# Patient Record
Sex: Female | Born: 1990
Health system: Southern US, Community
[De-identification: ages and names within clinical notes are randomized; demographics above are authoritative.]

## PROBLEM LIST (undated history)

## (undated) DIAGNOSIS — Z8619 Personal history of other infectious and parasitic diseases: Secondary | ICD-10-CM

## (undated) HISTORY — DX: Personal history of other infectious and parasitic diseases: Z86.19

---

## 2010-10-25 HISTORY — PX: WISDOM TOOTH EXTRACTION: SHX21

## 2017-07-28 DIAGNOSIS — N979 Female infertility, unspecified: Secondary | ICD-10-CM | POA: Diagnosis not present

## 2017-07-28 DIAGNOSIS — Z01419 Encounter for gynecological examination (general) (routine) without abnormal findings: Secondary | ICD-10-CM | POA: Diagnosis not present

## 2017-08-05 ENCOUNTER — Other Ambulatory Visit (HOSPITAL_COMMUNITY): Payer: Self-pay | Admitting: Obstetrics and Gynecology

## 2017-08-05 DIAGNOSIS — Z3141 Encounter for fertility testing: Secondary | ICD-10-CM

## 2017-08-08 DIAGNOSIS — N979 Female infertility, unspecified: Secondary | ICD-10-CM | POA: Diagnosis not present

## 2017-08-11 ENCOUNTER — Encounter (HOSPITAL_COMMUNITY): Payer: Self-pay | Admitting: Radiology

## 2017-08-11 ENCOUNTER — Ambulatory Visit (HOSPITAL_COMMUNITY)
Admission: RE | Admit: 2017-08-11 | Discharge: 2017-08-11 | Disposition: A | Discharge status: Home / Self Care | Payer: 59 | Source: Ambulatory Visit | Attending: Obstetrics and Gynecology | Admitting: Obstetrics and Gynecology

## 2017-08-11 DIAGNOSIS — Z3141 Encounter for fertility testing: Secondary | ICD-10-CM | POA: Diagnosis not present

## 2017-08-11 MED ORDER — IOPAMIDOL (ISOVUE-300) INJECTION 61%
30.0000 mL | Freq: Once | INTRAVENOUS | Status: AC | PRN
  Administered 2017-08-11: 5 mL

## 2017-08-25 DIAGNOSIS — N979 Female infertility, unspecified: Secondary | ICD-10-CM | POA: Diagnosis not present

## 2017-10-10 DIAGNOSIS — N911 Secondary amenorrhea: Secondary | ICD-10-CM | POA: Diagnosis not present

## 2017-10-13 DIAGNOSIS — Z3401 Encounter for supervision of normal first pregnancy, first trimester: Secondary | ICD-10-CM | POA: Diagnosis not present

## 2017-10-13 DIAGNOSIS — Z13228 Encounter for screening for other metabolic disorders: Secondary | ICD-10-CM | POA: Diagnosis not present

## 2017-10-13 DIAGNOSIS — Z3685 Encounter for antenatal screening for Streptococcus B: Secondary | ICD-10-CM | POA: Diagnosis not present

## 2017-10-13 LAB — OB RESULTS CONSOLE ABO/RH: RH Type: POSITIVE

## 2017-10-13 LAB — OB RESULTS CONSOLE GC/CHLAMYDIA
Chlamydia: NEGATIVE
GC PROBE AMP, GENITAL: NEGATIVE

## 2017-10-13 LAB — OB RESULTS CONSOLE HEPATITIS B SURFACE ANTIGEN: Hepatitis B Surface Ag: NEGATIVE

## 2017-10-13 LAB — OB RESULTS CONSOLE RPR: RPR: NONREACTIVE

## 2017-10-13 LAB — OB RESULTS CONSOLE HIV ANTIBODY (ROUTINE TESTING): HIV: NONREACTIVE

## 2017-10-13 LAB — OB RESULTS CONSOLE RUBELLA ANTIBODY, IGM: Rubella: IMMUNE

## 2017-10-13 LAB — OB RESULTS CONSOLE ANTIBODY SCREEN: ANTIBODY SCREEN: NEGATIVE

## 2017-10-28 DIAGNOSIS — Z3480 Encounter for supervision of other normal pregnancy, unspecified trimester: Secondary | ICD-10-CM | POA: Diagnosis not present

## 2017-10-28 DIAGNOSIS — Z113 Encounter for screening for infections with a predominantly sexual mode of transmission: Secondary | ICD-10-CM | POA: Diagnosis not present

## 2017-10-28 DIAGNOSIS — Z3401 Encounter for supervision of normal first pregnancy, first trimester: Secondary | ICD-10-CM | POA: Diagnosis not present

## 2017-10-28 LAB — HM PAP SMEAR: HM Pap smear: NORMAL

## 2017-11-17 DIAGNOSIS — Z3683 Encounter for fetal screening for congenital cardiac abnormalities: Secondary | ICD-10-CM | POA: Diagnosis not present

## 2017-11-17 DIAGNOSIS — Z3A12 12 weeks gestation of pregnancy: Secondary | ICD-10-CM | POA: Diagnosis not present

## 2017-11-17 DIAGNOSIS — Z36 Encounter for antenatal screening for chromosomal anomalies: Secondary | ICD-10-CM | POA: Diagnosis not present

## 2017-11-17 DIAGNOSIS — Z3491 Encounter for supervision of normal pregnancy, unspecified, first trimester: Secondary | ICD-10-CM | POA: Diagnosis not present

## 2017-12-29 DIAGNOSIS — Z3A18 18 weeks gestation of pregnancy: Secondary | ICD-10-CM | POA: Diagnosis not present

## 2017-12-29 DIAGNOSIS — Z3400 Encounter for supervision of normal first pregnancy, unspecified trimester: Secondary | ICD-10-CM | POA: Diagnosis not present

## 2017-12-29 DIAGNOSIS — Z363 Encounter for antenatal screening for malformations: Secondary | ICD-10-CM | POA: Diagnosis not present

## 2018-02-23 DIAGNOSIS — Z23 Encounter for immunization: Secondary | ICD-10-CM | POA: Diagnosis not present

## 2018-02-23 DIAGNOSIS — Z3402 Encounter for supervision of normal first pregnancy, second trimester: Secondary | ICD-10-CM | POA: Diagnosis not present

## 2018-02-23 DIAGNOSIS — Z3480 Encounter for supervision of other normal pregnancy, unspecified trimester: Secondary | ICD-10-CM | POA: Diagnosis not present

## 2018-03-12 NOTE — Progress Notes (Signed)
Subjective:    Patient ID: Ashley Allison, female    DOB: Oct 26, 1990, 27 y.o.   MRN: [id number]  HPI Chief Complaint  Patient presents with  . new cpe    new cpe, non fasting   She is new to the practice and here for a complete physical exam. Previous medical care: OB/GYN Lived in [place], [place] prior to moving here. Graduated from [employer].   States she is [redacted] weeks pregnant with her first child. She is getting regular perinatal care. Denies any issues or concerns.   Other providers: Dr. Velvet Bathe [facility]   Social history: Lives with her husband, works as a Advertising account planner PT  Denies smoking, drinking alcohol, drug use  Diet: fairly healthy  Excerise: very active at work  Immunizations: [employer] employee  Health maintenance:  Mammogram: N/A Colonoscopy: N/A Last Gynecological Exam:  Last Menstrual cycle: Pregnancies: 1 Last Dental Exam: twice annually  Last Eye Exam: June 2018 contacts   Wears seatbelt always, uses sunscreen, smoke detectors in home and functioning, does not text while driving and feels safe in home environment.   Reviewed allergies, medications, past medical, surgical, family, and social history.   Review of Systems Review of Systems Constitutional: -fever, -chills, -sweats, -unexpected weight change,-fatigue ENT: -runny nose, -ear pain, -sore throat Cardiology:  -chest pain, -palpitations, -edema Respiratory: -cough, -shortness of breath, -wheezing Gastroenterology: -abdominal pain, -nausea, -vomiting, -diarrhea, -constipation  Hematology: -bleeding or bruising problems Musculoskeletal: -arthralgias, -myalgias, -joint swelling, -back pain Ophthalmology: -vision changes Urology: -dysuria, -difficulty urinating, -hematuria, -urinary frequency, -urgency Neurology: -headache, -weakness, -tingling, -numbness       Objective:   Physical Exam BP 120/64   Pulse 100   Resp 16   Ht 5' 5.5" (1.664 m)   Wt 155 lb 8 oz (70.5 kg)   LMP 08/08/2017  (Approximate)   SpO2 98%   BMI 25.48 kg/m   General Appearance:    Alert, cooperative, no distress, appears stated age  Head:    Normocephalic, without obvious abnormality, atraumatic  Eyes:    PERRL, conjunctiva/corneas clear, EOM's intact, fundi    benign  Ears:    Normal TM's and external ear canals  Nose:   Nares normal, mucosa normal, no drainage or sinus   tenderness  Throat:   Lips, mucosa, and tongue normal; teeth and gums normal  Neck:   Supple, no lymphadenopathy;  thyroid:  no   enlargement/tenderness/nodules; no carotid   bruit or JVD  Back:    Spine nontender, no curvature, ROM normal, no CVA     tenderness  Lungs:     Clear to auscultation bilaterally without wheezes, rales or     ronchi; respirations unlabored  Chest Wall:    No tenderness or deformity   Heart:    Regular rate and rhythm, S1 and S2 normal, no murmur, rub   or gallop  Breast Exam:    OB/GYN   Abdomen:     Pregnant, normoactive bowel sounds      Genitalia:    OB/GYN. Pregnant.   Rectal:    Not performed due to age<40 and no related complaints  Extremities:   No clubbing, cyanosis or edema  Pulses:   2+ and symmetric all extremities  Skin:   Skin color, texture, turgor normal, no rashes or lesions  Lymph nodes:   Cervical, supraclavicular, and axillary nodes normal  Neurologic:   CNII-XII intact, normal strength, sensation and gait; reflexes 2+ and symmetric throughout  Psych:   Normal mood, affect, hygiene and grooming.     Urinalysis dipstick: negative       Assessment & Plan:  Routine general medical examination at a health care facility - Plan: POCT Urinalysis DIP (Proadvantage Device), CBC with Differential/Platelet, Comprehensive metabolic panel, Lipid panel  Screening for lipid disorders - Plan: Lipid panel  [redacted] weeks gestation of pregnancy  She appears to be doing well physically and emotionally.  Getting regular prenatal care and her first pregnancy seems to be going  well. Discussed safety and health promotion. Labs done per patient request. Follow-up as needed.

## 2018-03-13 ENCOUNTER — Ambulatory Visit (INDEPENDENT_AMBULATORY_CARE_PROVIDER_SITE_OTHER): Payer: 59 | Admitting: Family Medicine

## 2018-03-13 ENCOUNTER — Encounter: Payer: Self-pay | Admitting: Family Medicine

## 2018-03-13 VITALS — BP 120/64 | HR 100 | Resp 16 | Ht 65.5 in | Wt 155.5 lb

## 2018-03-13 DIAGNOSIS — Z0000 Encounter for general adult medical examination without abnormal findings: Secondary | ICD-10-CM

## 2018-03-13 DIAGNOSIS — Z13220 Encounter for screening for lipoid disorders: Secondary | ICD-10-CM

## 2018-03-13 DIAGNOSIS — Z3A29 29 weeks gestation of pregnancy: Secondary | ICD-10-CM

## 2018-03-13 LAB — POCT URINALYSIS DIP (PROADVANTAGE DEVICE)
BILIRUBIN UA: NEGATIVE mg/dL
Bilirubin, UA: NEGATIVE
Blood, UA: NEGATIVE
Glucose, UA: NEGATIVE mg/dL
Leukocytes, UA: NEGATIVE
Nitrite, UA: NEGATIVE
PH UA: 7 (ref 5.0–8.0)
PROTEIN UA: NEGATIVE mg/dL
Specific Gravity, Urine: 1.015
Urobilinogen, Ur: NEGATIVE

## 2018-03-13 NOTE — Patient Instructions (Signed)

## 2018-03-14 ENCOUNTER — Encounter: Payer: Self-pay | Admitting: Family Medicine

## 2018-03-14 LAB — COMPREHENSIVE METABOLIC PANEL
ALT: 14 IU/L (ref 0–32)
AST: 17 IU/L (ref 0–40)
Albumin/Globulin Ratio: 1.5 (ref 1.2–2.2)
Albumin: 3.7 g/dL (ref 3.5–5.5)
Alkaline Phosphatase: 114 IU/L (ref 39–117)
BILIRUBIN TOTAL: 0.7 mg/dL (ref 0.0–1.2)
BUN/Creatinine Ratio: 16 (ref 9–23)
BUN: 7 mg/dL (ref 6–20)
CHLORIDE: 102 mmol/L (ref 96–106)
CO2: 19 mmol/L — AB (ref 20–29)
CREATININE: 0.45 mg/dL — AB (ref 0.57–1.00)
Calcium: 9.3 mg/dL (ref 8.7–10.2)
GFR calc non Af Amer: 137 mL/min/{1.73_m2} (ref >59)
GFR, EST AFRICAN AMERICAN: 158 mL/min/{1.73_m2} (ref >59)
GLUCOSE: 46 mg/dL — AB (ref 65–99)
Globulin, Total: 2.4 g/dL (ref 1.5–4.5)
Potassium: 3.7 mmol/L (ref 3.5–5.2)
Sodium: 140 mmol/L (ref 134–144)
Total Protein: 6.1 g/dL (ref 6.0–8.5)

## 2018-03-14 LAB — CBC WITH DIFFERENTIAL/PLATELET
BASOS ABS: 0 10*3/uL (ref 0.0–0.2)
Basos: 0 %
EOS (ABSOLUTE): 0.1 10*3/uL (ref 0.0–0.4)
Eos: 1 %
HEMOGLOBIN: 12.5 g/dL (ref 11.1–15.9)
Hematocrit: 36.5 % (ref 34.0–46.6)
IMMATURE GRANULOCYTES: 0 %
Immature Grans (Abs): 0 10*3/uL (ref 0.0–0.1)
LYMPHS ABS: 1.8 10*3/uL (ref 0.7–3.1)
Lymphs: 16 %
MCH: 31.5 pg (ref 26.6–33.0)
MCHC: 34.2 g/dL (ref 31.5–35.7)
MCV: 92 fL (ref 79–97)
Monocytes Absolute: 0.5 10*3/uL (ref 0.1–0.9)
Monocytes: 5 %
NEUTROS ABS: 8.7 10*3/uL — AB (ref 1.4–7.0)
Neutrophils: 78 %
PLATELETS: 199 10*3/uL (ref 150–450)
RBC: 3.97 x10E6/uL (ref 3.77–5.28)
RDW: 13.9 % (ref 12.3–15.4)
WBC: 11.2 10*3/uL — ABNORMAL HIGH (ref 3.4–10.8)

## 2018-03-14 LAB — LIPID PANEL
CHOLESTEROL TOTAL: 272 mg/dL — AB (ref 100–199)
Chol/HDL Ratio: 3.7 ratio (ref 0.0–4.4)
HDL: 73 mg/dL (ref >39)
LDL CALC: 163 mg/dL — AB (ref 0–99)
Triglycerides: 182 mg/dL — ABNORMAL HIGH (ref 0–149)
VLDL CHOLESTEROL CAL: 36 mg/dL (ref 5–40)

## 2018-04-26 DIAGNOSIS — Z3685 Encounter for antenatal screening for Streptococcus B: Secondary | ICD-10-CM | POA: Diagnosis not present

## 2018-04-26 LAB — OB RESULTS CONSOLE GBS: STREP GROUP B AG: NEGATIVE

## 2018-05-18 ENCOUNTER — Encounter (HOSPITAL_COMMUNITY): Payer: Self-pay | Admitting: *Deleted

## 2018-05-18 ENCOUNTER — Telehealth (HOSPITAL_COMMUNITY): Payer: Self-pay | Admitting: *Deleted

## 2018-05-18 NOTE — Telephone Encounter (Signed)
Preadmission screen  

## 2018-05-30 ENCOUNTER — Inpatient Hospital Stay (HOSPITAL_COMMUNITY): Payer: 59 | Admitting: Anesthesiology

## 2018-05-30 ENCOUNTER — Other Ambulatory Visit: Payer: Self-pay

## 2018-05-30 ENCOUNTER — Inpatient Hospital Stay (HOSPITAL_COMMUNITY)
Admission: RE | Admit: 2018-05-30 | Discharge: 2018-06-02 | DRG: 788 | Disposition: A | Discharge status: Home / Self Care | Payer: 59 | Attending: Obstetrics and Gynecology | Admitting: Obstetrics and Gynecology

## 2018-05-30 ENCOUNTER — Encounter (HOSPITAL_COMMUNITY): Payer: Self-pay

## 2018-05-30 DIAGNOSIS — Z3A40 40 weeks gestation of pregnancy: Secondary | ICD-10-CM

## 2018-05-30 DIAGNOSIS — Z3483 Encounter for supervision of other normal pregnancy, third trimester: Secondary | ICD-10-CM | POA: Diagnosis present

## 2018-05-30 DIAGNOSIS — Z3A00 Weeks of gestation of pregnancy not specified: Secondary | ICD-10-CM | POA: Diagnosis not present

## 2018-05-30 DIAGNOSIS — O411230 Chorioamnionitis, third trimester, not applicable or unspecified: Secondary | ICD-10-CM | POA: Diagnosis not present

## 2018-05-30 DIAGNOSIS — Z98891 History of uterine scar from previous surgery: Secondary | ICD-10-CM

## 2018-05-30 LAB — CBC
HCT: 38.1 % (ref 36.0–46.0)
Hemoglobin: 13.3 g/dL (ref 12.0–15.0)
MCH: 32.1 pg (ref 26.0–34.0)
MCHC: 34.9 g/dL (ref 30.0–36.0)
MCV: 92 fL (ref 78.0–100.0)
PLATELETS: 167 10*3/uL (ref 150–400)
RBC: 4.14 MIL/uL (ref 3.87–5.11)
RDW: 13.5 % (ref 11.5–15.5)
WBC: 9.8 10*3/uL (ref 4.0–10.5)

## 2018-05-30 LAB — TYPE AND SCREEN
ABO/RH(D): AB POS
ANTIBODY SCREEN: NEGATIVE

## 2018-05-30 LAB — ABO/RH: ABO/RH(D): AB POS

## 2018-05-30 LAB — RPR: RPR Ser Ql: NONREACTIVE

## 2018-05-30 LAB — POCT FERN TEST: POCT FERN TEST: POSITIVE

## 2018-05-30 MED ORDER — EPHEDRINE 5 MG/ML INJ: 10.0000 mg | INTRAVENOUS | Status: DC | PRN

## 2018-05-30 MED ORDER — PHENYLEPHRINE 40 MCG/ML (10ML) SYRINGE FOR IV PUSH (FOR BLOOD PRESSURE SUPPORT): 80.0000 ug | PREFILLED_SYRINGE | INTRAVENOUS | Status: DC | PRN

## 2018-05-30 MED ORDER — FENTANYL 2.5 MCG/ML BUPIVACAINE 1/10 % EPIDURAL INFUSION (WH - ANES)
14.0000 mL/h | INTRAMUSCULAR | Status: DC | PRN
  Administered 2018-05-30 (×2): 14 mL/h via EPIDURAL
  Filled 2018-05-30 (×2): qty 100

## 2018-05-30 MED ORDER — OXYCODONE-ACETAMINOPHEN 5-325 MG PO TABS: 1.0000 | ORAL_TABLET | ORAL | Status: DC | PRN

## 2018-05-30 MED ORDER — OXYTOCIN 40 UNITS IN LACTATED RINGERS INFUSION - SIMPLE MED
1.0000 m[IU]/min | INTRAVENOUS | Status: DC
  Administered 2018-05-30: 2 m[IU]/min via INTRAVENOUS
  Filled 2018-05-30: qty 1000

## 2018-05-30 MED ORDER — LACTATED RINGERS IV SOLN
500.0000 mL | INTRAVENOUS | Status: DC | PRN
  Administered 2018-05-30: 500 mL via INTRAVENOUS

## 2018-05-30 MED ORDER — LACTATED RINGERS IV SOLN: 500.0000 mL | Freq: Once | INTRAVENOUS | Status: DC

## 2018-05-30 MED ORDER — OXYTOCIN 40 UNITS IN LACTATED RINGERS INFUSION - SIMPLE MED: 2.5000 [IU]/h | INTRAVENOUS | Status: DC

## 2018-05-30 MED ORDER — ONDANSETRON HCL 4 MG/2ML IJ SOLN
4.0000 mg | Freq: Four times a day (QID) | INTRAMUSCULAR | Status: DC | PRN
  Administered 2018-05-30: 4 mg via INTRAVENOUS
  Filled 2018-05-30: qty 2

## 2018-05-30 MED ORDER — DIPHENHYDRAMINE HCL 50 MG/ML IJ SOLN: 12.5000 mg | INTRAMUSCULAR | Status: DC | PRN

## 2018-05-30 MED ORDER — PHENYLEPHRINE 40 MCG/ML (10ML) SYRINGE FOR IV PUSH (FOR BLOOD PRESSURE SUPPORT)
80.0000 ug | PREFILLED_SYRINGE | INTRAVENOUS | Status: DC | PRN
  Filled 2018-05-30: qty 10

## 2018-05-30 MED ORDER — TERBUTALINE SULFATE 1 MG/ML IJ SOLN: 0.2500 mg | Freq: Once | INTRAMUSCULAR | Status: DC | PRN

## 2018-05-30 MED ORDER — LIDOCAINE HCL (PF) 1 % IJ SOLN
INTRAMUSCULAR | Status: DC | PRN
  Administered 2018-05-30: 5 mL via EPIDURAL

## 2018-05-30 MED ORDER — LIDOCAINE HCL (PF) 1 % IJ SOLN: 30.0000 mL | INTRAMUSCULAR | Status: DC | PRN

## 2018-05-30 MED ORDER — SOD CITRATE-CITRIC ACID 500-334 MG/5ML PO SOLN
30.0000 mL | ORAL | Status: DC | PRN
  Administered 2018-05-30 – 2018-05-31 (×2): 30 mL via ORAL
  Filled 2018-05-30 (×2): qty 15

## 2018-05-30 MED ORDER — ACETAMINOPHEN 325 MG PO TABS: 650.0000 mg | ORAL_TABLET | ORAL | Status: DC | PRN

## 2018-05-30 MED ORDER — OXYCODONE-ACETAMINOPHEN 5-325 MG PO TABS: 2.0000 | ORAL_TABLET | ORAL | Status: DC | PRN

## 2018-05-30 MED ORDER — OXYTOCIN BOLUS FROM INFUSION: 500.0000 mL | Freq: Once | INTRAVENOUS | Status: DC

## 2018-05-30 MED ORDER — FLEET ENEMA 7-19 GM/118ML RE ENEM
1.0000 | ENEMA | RECTAL | Status: DC | PRN
Start: 2018-05-30 — End: 2018-05-31

## 2018-05-30 MED ORDER — LACTATED RINGERS IV SOLN
INTRAVENOUS | Status: DC
  Administered 2018-05-30 – 2018-05-31 (×4): via INTRAVENOUS

## 2018-05-30 NOTE — Anesthesia Pain Management Evaluation Note (Signed)
  CRNA Pain Management Visit Note  Patient: Ashley Allison, 27 y.o., female  "Hello I am a member of the anesthesia team at [facility]. We have an anesthesia team available at all times to provide care throughout the hospital, including epidural management and anesthesia for C-section. I don't know your plan for the delivery whether it a natural birth, water birth, IV sedation, nitrous supplementation, doula or epidural, but we want to meet your pain goals."   1.Was your pain managed to your expectations on prior hospitalizations?   No prior hospitalizations  2.What is your expectation for pain management during this hospitalization?     Epidural  3.How can we help you reach that goal? Epidural when ready  Record the patient's initial score and the patient's pain goal.   Pain: 6  Pain Goal: 8 [facility] wants you to be able to say your pain was always managed very well.  Edison PaceWILKERSON,Milena Liggett 05/30/2018

## 2018-05-30 NOTE — Anesthesia Preprocedure Evaluation (Signed)
Anesthesia Evaluation  Patient identified by MRN, date of birth, ID band Patient awake    Reviewed: Allergy & Precautions, NPO status , Patient's Chart, lab work & pertinent test results  Airway Mallampati: II  TM Distance: >3 FB Neck ROM: Full    Dental no notable dental hx. (+) Teeth Intact   Pulmonary neg pulmonary ROS,    Pulmonary exam normal breath sounds clear to auscultation       Cardiovascular Exercise Tolerance: Good negative cardio ROS Normal cardiovascular exam Rhythm:Regular Rate:Normal     Neuro/Psych negative neurological ROS  negative psych ROS   GI/Hepatic negative GI ROS, Neg liver ROS,   Endo/Other    Renal/GU negative Renal ROS     Musculoskeletal   Abdominal   Peds  Hematology  (+) anemia ,   Anesthesia Other Findings   Reproductive/Obstetrics (+) Pregnancy                             Lab Results  Component Value Date   WBC 9.8 05/30/2018   HGB 13.3 05/30/2018   HCT 38.1 05/30/2018   MCV 92.0 05/30/2018   PLT 167 05/30/2018    Anesthesia Physical Anesthesia Plan  ASA: II  Anesthesia Plan: Epidural   Post-op Pain Management:    Induction:   PONV Risk Score and Plan:   Airway Management Planned:   Additional Equipment:   Intra-op Plan:   Post-operative Plan:   Informed Consent: I have reviewed the patients History and Physical, chart, labs and discussed the procedure including the risks, benefits and alternatives for the proposed anesthesia with the patient or authorized representative who has indicated his/her understanding and acceptance.     Plan Discussed with:   Anesthesia Plan Comments:         Anesthesia Quick Evaluation

## 2018-05-30 NOTE — Anesthesia Procedure Notes (Signed)
Epidural Patient location during procedure: OB Start time: 05/30/2018 11:28 AM End time: 05/30/2018 11:44 AM  Staffing Anesthesiologist: Trevor IhaHouser, Stephen A, MD Performed: anesthesiologist   Preanesthetic Checklist Completed: patient identified, site marked, surgical consent, pre-op evaluation, timeout performed, IV checked, risks and benefits discussed and monitors and equipment checked  Epidural Patient position: sitting Prep: site prepped and draped and DuraPrep Patient monitoring: continuous pulse ox and blood pressure Approach: midline Location: L3-L4 Injection technique: LOR air  Needle:  Needle type: Tuohy  Needle gauge: 17 G Needle length: 9 cm and 9 Needle insertion depth: 5 cm cm Catheter type: closed end flexible Catheter size: 19 Gauge Catheter at skin depth: 11 cm Test dose: negative  Assessment Events: blood not aspirated, injection not painful, no injection resistance, negative IV test and no paresthesia  Additional Notes One attempt . Pt tolerated procedure well.

## 2018-05-30 NOTE — Progress Notes (Signed)
Pt comfortable w/ ctx  FHT cat 1 Toco Q2, mild Cvx 4/80/-2  A/P:  Continue pitocin Exp mngt

## 2018-05-30 NOTE — Progress Notes (Signed)
Pt feeling occasional ctx  FHT cat 1 Toco Q10 Cvx 2/60/-2, vtx  A/P  Will augment w/ pitocin

## 2018-05-30 NOTE — Progress Notes (Signed)
Comfortable w/ epidural  FHT 120s w/ occasional early decels Toco Q2 Cvx 8/C/0 IUPC & FSE placed  A/P:  Continue pitocin augmentation Exp mngt

## 2018-05-30 NOTE — H&P (Signed)
Oda CoganKimberly Quinonez is a 27 y.o. female presenting for SROM, clear fluid.  Pregnancy uncomplicated.  OB History    Gravida  1   Para      Term      Preterm      AB      Living        SAB      TAB      Ectopic      Multiple      Live Births             Past Medical History:  Diagnosis Date  . History of chicken pox    Past Surgical History:  Procedure Laterality Date  . WISDOM TOOTH EXTRACTION  2012   Family History: family history includes Bladder Cancer in her maternal grandmother; Breast cancer in her paternal aunt; Hypertension in her father; Liver cancer (age of onset: 1984) in her paternal grandfather. Social History:  reports that she has never smoked. She has never used smokeless tobacco. She reports that she drinks alcohol. She reports that she does not use drugs.     Maternal Diabetes: No Genetic Screening: Normal Maternal Ultrasounds/Referrals: Normal Fetal Ultrasounds or other Referrals:  None Maternal Substance Abuse:  No Significant Maternal Medications:  None Significant Maternal Lab Results:  None Other Comments:  None  ROS History Dilation: 1 Effacement (%): 70 Station: -2 Exam by:: T Lytle RN Blood pressure 133/82, pulse 88, temperature 98.2 F (36.8 C), temperature source Oral, resp. rate 17, height 5\' 5"  (1.651 m), weight 173 lb 1.3 oz (78.5 kg), last menstrual period 08/08/2017. Exam Physical Exam  Prenatal labs: ABO, Rh: A/Positive/-- (12/20 0000) Antibody: Negative (12/20 0000) Rubella: Immune (12/20 0000) RPR: Nonreactive (12/20 0000)  HBsAg: Negative (12/20 0000)  HIV: Non-reactive (12/20 0000)  GBS:   negative  Assessment/Plan: Admit Pitocin augmentation  Zelphia CairoGretchen Jamauri Kruzel 05/30/2018, 7:59 AM

## 2018-05-30 NOTE — MAU Note (Signed)
Pt felt gush of fluid at 0330 and again at 0530, clear fluid. Also has been cramping since then. Denies bleeding. +FM

## 2018-05-31 ENCOUNTER — Inpatient Hospital Stay (HOSPITAL_COMMUNITY): Admission: RE | Admit: 2018-05-31 | Payer: 59 | Source: Ambulatory Visit

## 2018-05-31 ENCOUNTER — Encounter (HOSPITAL_COMMUNITY)
Admission: RE | Disposition: A | Discharge status: Home / Self Care | Payer: Self-pay | Source: Home / Self Care | Attending: Obstetrics and Gynecology

## 2018-05-31 ENCOUNTER — Encounter (HOSPITAL_COMMUNITY): Payer: Self-pay

## 2018-05-31 DIAGNOSIS — Z98891 History of uterine scar from previous surgery: Secondary | ICD-10-CM

## 2018-05-31 LAB — CBC
HEMATOCRIT: 29.8 % — AB (ref 36.0–46.0)
Hemoglobin: 10.7 g/dL — ABNORMAL LOW (ref 12.0–15.0)
MCH: 32.7 pg (ref 26.0–34.0)
MCHC: 35.9 g/dL (ref 30.0–36.0)
MCV: 91.1 fL (ref 78.0–100.0)
Platelets: 144 10*3/uL — ABNORMAL LOW (ref 150–400)
RBC: 3.27 MIL/uL — ABNORMAL LOW (ref 3.87–5.11)
RDW: 13.2 % (ref 11.5–15.5)
WBC: 16.1 10*3/uL — ABNORMAL HIGH (ref 4.0–10.5)

## 2018-05-31 SURGERY — Surgical Case
Anesthesia: Epidural

## 2018-05-31 MED ORDER — ONDANSETRON HCL 4 MG/2ML IJ SOLN
INTRAMUSCULAR | Status: AC
  Filled 2018-05-31: qty 2

## 2018-05-31 MED ORDER — WITCH HAZEL-GLYCERIN EX PADS: 1.0000 "application " | MEDICATED_PAD | CUTANEOUS | Status: DC | PRN

## 2018-05-31 MED ORDER — TETANUS-DIPHTH-ACELL PERTUSSIS 5-2.5-18.5 LF-MCG/0.5 IM SUSP
0.5000 mL | Freq: Once | INTRAMUSCULAR | Status: DC
  Filled 2018-05-31: qty 0.5

## 2018-05-31 MED ORDER — MEPERIDINE HCL 25 MG/ML IJ SOLN: 6.2500 mg | INTRAMUSCULAR | Status: DC | PRN

## 2018-05-31 MED ORDER — OXYCODONE-ACETAMINOPHEN 5-325 MG PO TABS: 1.0000 | ORAL_TABLET | ORAL | Status: DC | PRN

## 2018-05-31 MED ORDER — PROMETHAZINE HCL 25 MG/ML IJ SOLN: 6.2500 mg | INTRAMUSCULAR | Status: DC | PRN

## 2018-05-31 MED ORDER — OXYCODONE-ACETAMINOPHEN 5-325 MG PO TABS: 2.0000 | ORAL_TABLET | ORAL | Status: DC | PRN

## 2018-05-31 MED ORDER — MEDROXYPROGESTERONE ACETATE 150 MG/ML IM SUSP: 150.0000 mg | INTRAMUSCULAR | Status: DC | PRN

## 2018-05-31 MED ORDER — FENTANYL CITRATE (PF) 100 MCG/2ML IJ SOLN
INTRAMUSCULAR | Status: AC
Start: 2018-05-31 — End: ?
  Filled 2018-05-31: qty 2

## 2018-05-31 MED ORDER — HYDROMORPHONE HCL 1 MG/ML IJ SOLN: 0.2500 mg | INTRAMUSCULAR | Status: DC | PRN

## 2018-05-31 MED ORDER — KETOROLAC TROMETHAMINE 30 MG/ML IJ SOLN
INTRAMUSCULAR | Status: AC
  Filled 2018-05-31: qty 1

## 2018-05-31 MED ORDER — SCOPOLAMINE 1 MG/3DAYS TD PT72
MEDICATED_PATCH | TRANSDERMAL | Status: DC | PRN
  Administered 2018-05-31: 1 via TRANSDERMAL

## 2018-05-31 MED ORDER — SENNOSIDES-DOCUSATE SODIUM 8.6-50 MG PO TABS
2.0000 | ORAL_TABLET | ORAL | Status: DC
  Administered 2018-06-01 (×2): 2 via ORAL
  Filled 2018-05-31 (×4): qty 2

## 2018-05-31 MED ORDER — PRENATAL MULTIVITAMIN CH
1.0000 | ORAL_TABLET | Freq: Every day | ORAL | Status: DC
  Administered 2018-05-31 – 2018-06-02 (×3): 1 via ORAL
  Filled 2018-05-31 (×5): qty 1

## 2018-05-31 MED ORDER — ONDANSETRON HCL 4 MG/2ML IJ SOLN
INTRAMUSCULAR | Status: DC | PRN
  Administered 2018-05-31: 4 mg via INTRAVENOUS

## 2018-05-31 MED ORDER — DIBUCAINE 1 % RE OINT
1.0000 "application " | TOPICAL_OINTMENT | RECTAL | Status: DC | PRN
  Filled 2018-05-31: qty 28

## 2018-05-31 MED ORDER — LACTATED RINGERS IV SOLN
INTRAVENOUS | Status: DC | PRN
  Administered 2018-05-31: 03:00:00 via INTRAVENOUS
  Administered 2018-05-31: 40 [IU] via INTRAVENOUS

## 2018-05-31 MED ORDER — COCONUT OIL OIL
1.0000 "application " | TOPICAL_OIL | Status: DC | PRN
  Administered 2018-06-02: 1 via TOPICAL
  Filled 2018-05-31 (×2): qty 120

## 2018-05-31 MED ORDER — KETOROLAC TROMETHAMINE 30 MG/ML IJ SOLN: 30.0000 mg | Freq: Once | INTRAMUSCULAR | Status: DC | PRN

## 2018-05-31 MED ORDER — CEFAZOLIN SODIUM-DEXTROSE 2-3 GM-%(50ML) IV SOLR
INTRAVENOUS | Status: DC | PRN
  Administered 2018-05-31: 2 g via INTRAVENOUS

## 2018-05-31 MED ORDER — ACETAMINOPHEN 325 MG PO TABS
650.0000 mg | ORAL_TABLET | ORAL | Status: DC | PRN
  Administered 2018-06-02: 650 mg via ORAL
  Filled 2018-05-31: qty 2

## 2018-05-31 MED ORDER — DIPHENHYDRAMINE HCL 25 MG PO CAPS
25.0000 mg | ORAL_CAPSULE | Freq: Four times a day (QID) | ORAL | Status: DC | PRN
  Filled 2018-05-31: qty 1

## 2018-05-31 MED ORDER — MENTHOL 3 MG MT LOZG
1.0000 | LOZENGE | OROMUCOSAL | Status: DC | PRN
  Filled 2018-05-31: qty 9

## 2018-05-31 MED ORDER — IBUPROFEN 600 MG PO TABS
600.0000 mg | ORAL_TABLET | Freq: Four times a day (QID) | ORAL | Status: DC
  Administered 2018-05-31 – 2018-06-02 (×9): 600 mg via ORAL
  Filled 2018-05-31 (×9): qty 1

## 2018-05-31 MED ORDER — DEXTROSE IN LACTATED RINGERS 5 % IV SOLN
INTRAVENOUS | Status: DC
  Administered 2018-05-31 (×2): via INTRAVENOUS

## 2018-05-31 MED ORDER — FENTANYL CITRATE (PF) 100 MCG/2ML IJ SOLN
INTRAMUSCULAR | Status: DC | PRN
  Administered 2018-05-31: 100 ug via EPIDURAL

## 2018-05-31 MED ORDER — MEASLES, MUMPS & RUBELLA VAC ~~LOC~~ INJ
0.5000 mL | INJECTION | Freq: Once | SUBCUTANEOUS | Status: DC
  Filled 2018-05-31: qty 0.5

## 2018-05-31 MED ORDER — SIMETHICONE 80 MG PO CHEW
80.0000 mg | CHEWABLE_TABLET | ORAL | Status: DC
  Administered 2018-06-01 (×2): 80 mg via ORAL
  Filled 2018-05-31 (×4): qty 1

## 2018-05-31 MED ORDER — OXYTOCIN 10 UNIT/ML IJ SOLN
INTRAMUSCULAR | Status: AC
  Filled 2018-05-31: qty 4

## 2018-05-31 MED ORDER — DEXAMETHASONE SODIUM PHOSPHATE 10 MG/ML IJ SOLN
INTRAMUSCULAR | Status: DC | PRN
  Administered 2018-05-31: 4 mg via INTRAVENOUS

## 2018-05-31 MED ORDER — SIMETHICONE 80 MG PO CHEW
80.0000 mg | CHEWABLE_TABLET | ORAL | Status: DC | PRN
  Filled 2018-05-31: qty 1

## 2018-05-31 MED ORDER — DEXAMETHASONE SODIUM PHOSPHATE 4 MG/ML IJ SOLN
INTRAMUSCULAR | Status: AC
Start: 2018-05-31 — End: ?
  Filled 2018-05-31: qty 1

## 2018-05-31 MED ORDER — OXYTOCIN 40 UNITS IN LACTATED RINGERS INFUSION - SIMPLE MED: 2.5000 [IU]/h | INTRAVENOUS | Status: AC

## 2018-05-31 MED ORDER — SODIUM BICARBONATE 8.4 % IV SOLN
INTRAVENOUS | Status: DC | PRN
  Administered 2018-05-31: 10 mL via EPIDURAL

## 2018-05-31 MED ORDER — ACETAMINOPHEN 10 MG/ML IV SOLN
INTRAVENOUS | Status: DC | PRN
  Administered 2018-05-31: 1000 mg via INTRAVENOUS

## 2018-05-31 MED ORDER — SIMETHICONE 80 MG PO CHEW
80.0000 mg | CHEWABLE_TABLET | Freq: Three times a day (TID) | ORAL | Status: DC
  Administered 2018-05-31 – 2018-06-01 (×5): 80 mg via ORAL
  Filled 2018-05-31 (×12): qty 1

## 2018-05-31 SURGICAL SUPPLY — 32 items
CHLORAPREP W/TINT 26ML (MISCELLANEOUS) ×3 IMPLANT
CLAMP CORD UMBIL (MISCELLANEOUS) ×3 IMPLANT
CLOTH BEACON ORANGE TIMEOUT ST (SAFETY) ×3 IMPLANT
DERMABOND ADHESIVE PROPEN (GAUZE/BANDAGES/DRESSINGS) ×2
DERMABOND ADVANCED (GAUZE/BANDAGES/DRESSINGS) ×2
DERMABOND ADVANCED .7 DNX12 (GAUZE/BANDAGES/DRESSINGS) ×1 IMPLANT
DERMABOND ADVANCED .7 DNX6 (GAUZE/BANDAGES/DRESSINGS) ×1 IMPLANT
DRSG OPSITE POSTOP 4X10 (GAUZE/BANDAGES/DRESSINGS) ×3 IMPLANT
ELECT REM PT RETURN 9FT ADLT (ELECTROSURGICAL) ×3
ELECTRODE REM PT RTRN 9FT ADLT (ELECTROSURGICAL) ×1 IMPLANT
EXTRACTOR VACUUM M CUP 4 TUBE (SUCTIONS) IMPLANT
EXTRACTOR VACUUM M CUP 4' TUBE (SUCTIONS)
GLOVE BIO SURGEON STRL SZ 6.5 (GLOVE) ×4 IMPLANT
GLOVE BIO SURGEONS STRL SZ 6.5 (GLOVE) ×2
GLOVE BIOGEL PI IND STRL 7.0 (GLOVE) ×4 IMPLANT
GLOVE BIOGEL PI INDICATOR 7.0 (GLOVE) ×8
GOWN STRL REUS W/TWL LRG LVL3 (GOWN DISPOSABLE) ×6 IMPLANT
KIT ABG SYR 3ML LUER SLIP (SYRINGE) IMPLANT
NEEDLE HYPO 25X5/8 SAFETYGLIDE (NEEDLE) IMPLANT
NS IRRIG 1000ML POUR BTL (IV SOLUTION) ×3 IMPLANT
PACK C SECTION WH (CUSTOM PROCEDURE TRAY) ×3 IMPLANT
PAD OB MATERNITY 4.3X12.25 (PERSONAL CARE ITEMS) ×3 IMPLANT
PENCIL SMOKE EVAC W/HOLSTER (ELECTROSURGICAL) ×3 IMPLANT
SUT CHROMIC 0 CT 802H (SUTURE) IMPLANT
SUT CHROMIC 0 CTX 36 (SUTURE) ×9 IMPLANT
SUT MON AB-0 CT1 36 (SUTURE) ×3 IMPLANT
SUT PDS AB 0 CTX 60 (SUTURE) ×3 IMPLANT
SUT PLAIN 0 NONE (SUTURE) IMPLANT
SUT VIC AB 4-0 KS 27 (SUTURE) ×3 IMPLANT
SYR BULB 3OZ (MISCELLANEOUS) ×3 IMPLANT
TOWEL OR 17X24 6PK STRL BLUE (TOWEL DISPOSABLE) ×3 IMPLANT
TRAY FOLEY W/BAG SLVR 14FR LF (SET/KITS/TRAYS/PACK) IMPLANT

## 2018-05-31 NOTE — Transfer of Care (Signed)
Immediate Anesthesia Transfer of Care Note  Patient: Ashley CoganKimberly Schowalter  Procedure(s) Performed: CESAREAN SECTION (N/A )  Patient Location: PACU  Anesthesia Type:Epidural  Level of Consciousness: awake  Airway & Oxygen Therapy: Patient Spontanous Breathing  Post-op Assessment: Report given to RN and Post -op Vital signs reviewed and stable  Post vital signs: Reviewed and stable  Last Vitals:  Vitals Value Taken Time  BP 117/80 05/31/2018  3:49 AM  Temp    Pulse 111 05/31/2018  3:53 AM  Resp 20 05/31/2018  3:53 AM  SpO2 99 % 05/31/2018  3:53 AM  Vitals shown include unvalidated device data.  Last Pain:  Vitals:   05/31/18 0114  TempSrc: Oral  PainSc:       Patients Stated Pain Goal: 8 (05/30/18 0810)  Complications: No apparent anesthesia complications

## 2018-05-31 NOTE — Lactation Note (Signed)
This note was copied from a baby's chart. Lactation Consultation Note  Patient Name: Ashley Allison Reason for consult: Follow-up assessment;Difficult latch  [name] whose infant is now 4719 hours old.  RN request for latch assistance  RN requested latch assistance due to baby being able to latch but not willing to suck.  Baby in bassinet as I arrived but showing feeding cues.  Offered to assist with latching and mother accepted.  Placed mother in her preferred position (football hold) with adequate pillow support.  Assisted baby to latch onto left breast without difficulty.  However, once at the breast he became very restless and fussy and would not initiate sucking.  I burped him and attempted again with the same results.  Suggested that mother hand express some colostrum while I assessed his suck.  Baby does not have a coordinated suck.  With my gloved finger I attempted to get him to latch and suck my finger.  Again, he did not willingly begin sucking.  With cheek and jaw support and practice he began sucking on my finger with strong bursts.  Without the cheek and jaw support he was unable to maintain a latch on my finger.  Showed the parents how I was helping him and suggested they continue this before latching.  Mother was able to hand express approximately 11 mls of colostrum which dad spoon fed back to baby while I held him and guided the feedings.  Baby quieted down after spoon feeding and burped easily.  Placed him back STS with mother where he was content.  Encouraged mother to continue feeding 8-12 times/24 hours or more if he shows cues.  Continue STS, breast massage and hand expression before and after feeds.  She has colostrum containers for any EBM she obtains and will feed back to baby using the spoon.  Parents very eager to learn and work well together.  Mother will call for latch assistance as needed.  I reassured her this may take a couple of feedings  but feel sure he will soon learn to suck more effectively.  Parents grateful for help and will call as needed.  RN updated.   Maternal Data Formula Feeding for Exclusion: No Has patient been taught Hand Expression?: Yes Does the patient have breastfeeding experience prior to this delivery?: No  Feeding Feeding Type: Breast Fed Length of feed: 0 min  LATCH Score Latch: Too sleepy or reluctant, no latch achieved, no sucking elicited.  Audible Swallowing: None  Type of Nipple: Everted at rest and after stimulation  Comfort (Breast/Nipple): Soft / non-tender  Hold (Positioning): Assistance needed to correctly position infant at breast and maintain latch.  LATCH Score: 5  Interventions Interventions: Breast feeding basics reviewed;Assisted with latch;Skin to skin;Breast massage;Hand express;Expressed milk;Position options;Support pillows;Adjust position;Breast compression  Lactation Tools Discussed/Used     Consult Status Consult Status: Follow-up Date: 06/01/18 Follow-up type: In-patient    Thornton Dohrmann R Woodley Petzold Allison, 11:02 PM

## 2018-05-31 NOTE — Progress Notes (Signed)
Pt comfortable.  FHT reassuring but 2-3 episodes of decels to 90s.  Resolves with position change and pitocin off Toco Q3-4, mild off pitocin Cvx 9/C/0  A/P:  Will restart pitocin discucssed c-section if FHT decels recur with pitocin back on

## 2018-05-31 NOTE — Progress Notes (Signed)
<  10 min after restart pitocin at - FHT decel to 90s Pitocin turned off and position change Recommend primary c-section, pt and husband agree R/b/a discussed, questions answered, informed consent

## 2018-05-31 NOTE — Anesthesia Postprocedure Evaluation (Signed)
Anesthesia Post Note  Patient: Ashley CoganKimberly Allison  Procedure(s) Performed: CESAREAN SECTION (N/A )     Patient location during evaluation: Mother Baby Anesthesia Type: Epidural Level of consciousness: awake and alert Pain management: pain level controlled Vital Signs Assessment: post-procedure vital signs reviewed and stable Respiratory status: spontaneous breathing, nonlabored ventilation and respiratory function stable Cardiovascular status: stable Postop Assessment: no headache, no backache, epidural receding, able to ambulate, adequate PO intake, no apparent nausea or vomiting and patient able to bend at knees Anesthetic complications: no    Last Vitals:  Vitals:   05/31/18 0828 05/31/18 0943  BP: 109/79 112/68  Pulse: 71 99  Resp: 16 16  Temp: 37.6 C 36.8 C  SpO2:      Last Pain:  Vitals:   05/31/18 0943  TempSrc: Axillary  PainSc:    Pain Goal: Patients Stated Pain Goal: 8 (05/30/18 0810)               Laban EmperorMalinova,Stacyann Mcconaughy Hristova

## 2018-05-31 NOTE — Lactation Note (Signed)
This note was copied from a baby's chart. Lactation Consultation Note Baby 6 hr old. Saw mom in PACU. Baby spitting, not interested in BF. Has rooted, not wanting to sustain latch to feed. Mom has semi flat short shaft compressible areolas/nipples. Hand expression demonstrated. Discussed briefly BF, newborn feeding habits, hand expression, spoon feeding, STS, cluster feeding, supply and demand. Taking hand pump, DEBP/kit, and shells to rm. Mom will be f/u at later time when baby is more active to feed.  Patient Name: Ashley Allison Date: 05/31/2018 Reason for consult: Initial assessment   Maternal Data Has patient been taught Hand Expression?: Yes Does the patient have breastfeeding experience prior to this delivery?: No  Feeding    LATCH Score Latch: Too sleepy or reluctant, no latch achieved, no sucking elicited.     Type of Nipple: Everted at rest and after stimulation(semi flat/very short shaft)  Comfort (Breast/Nipple): Soft / non-tender        Interventions Interventions: Skin to skin;Breast massage;Hand express;DEBP;Hand pump;Breast compression(saw in PACU took supplys to rm.)  Lactation Tools Discussed/Used Tools: Shells;Pump Shell Type: Inverted Breast pump type: Double-Electric Breast Pump;Manual   Consult Status Consult Status: Follow-up Date: 05/31/18 Follow-up type: In-patient    Theodoro Kalata 05/31/2018, 4:42 AM

## 2018-05-31 NOTE — Lactation Note (Addendum)
This note was copied from a baby's chart. Lactation Consultation Note  Patient Name: Ashley Allison [name]'s Date: 05/31/2018 Reason for consult: Follow-up assessment  When I entered room, parents were spoon-feeding "Ashley Ralpharker." Dad was taught how to allow infant to lap from spoon. Slight heart-shaped tongue was noted on extension with spoon feeding.  Mom reports significant breast changes w/pregnancy. She reports having leaked for the last 1-2 months. Mom is able to hand express well, but we were able to improve her technique.   We attempted to get Ashley RalphParker to latch to bare breast using the teacup hold, but he was unable to do so. Mom had been using a size 16 NS, but had not had much luck. We tried the size 20 NS & prefilled it with hand-expressed colostrum.  Overall, infant did well.  Mom knows that if we continue to use the nipple shield, we should express milk 4-6 times/day.  Mom is willing to learn about using a DEBP later today.   Lurline HareRichey, Tieisha Darden [facility] 05/31/2018, 1:34 PM

## 2018-05-31 NOTE — Consult Note (Signed)
Neonatology Note:   Attendance at C-section:    I was asked by Dr. Renaldo FiddlerAdkins to attend this primary C/S at term due to [facility]. The mother is a G1P0 A pos, GBS neg with an uncomplicated pregnancy. ROM 24 hours prior to delivery, fluid clear. Infant vigorous with good spontaneous cry and tone. Delayed cord clamping was done. Needed no suctioning. Ap 9/9. Lungs clear to ausc in DR. Infant is able to remain with his mother for skin to skin time under nursing supervision. Transferred to the care of Pediatrician.   Doretha Souhristie C. Novelle Addair, MD

## 2018-05-31 NOTE — Op Note (Signed)
Cesarean Section Procedure Note   Ashley Allison  05/30/2018 - 05/31/2018  Indications: fetal intolerance to labor   Pre-operative Diagnosis: primary cesarean section for fetal intolerance to labor.   Post-operative Diagnosis: Same   Surgeon: Moishe SpiceSurgeon(s) and Role:    Zelphia Cairo* Elmond Poehlman, MD - Primary   Assistants: none  Anesthesia: epidural   Procedure Details:  The patient was seen in the Holding Room. The risks, benefits, complications, treatment options, and expected outcomes were discussed with the patient. The patient concurred with the proposed plan, giving informed consent. identified as Ashley Allison and the procedure verified as C-Section Delivery. A Time Out was held and the above information confirmed.  After induction of anesthesia, the patient was draped and prepped in the usual sterile manner. A transverse incision was made and carried down through the subcutaneous tissue to the fascia. Fascial incision was made and extended transversely. The fascia was separated from the underlying rectus tissue superiorly and inferiorly. The peritoneum was identified and entered. Peritoneal incision was extended longitudinally. The utero-vesical peritoneal reflection was incised transversely and the bladder flap was bluntly freed from the lower uterine segment. A low transverse uterine incision was made. Delivered from cephalic presentation was a viable female infant. Cord ph was not sent the umbilical cord was clamped and cut cord blood was obtained for evaluation. The placenta was removed Intact and appeared normal. The uterine outline, tubes and ovaries appeared normal}. The uterine incision was closed with running locked sutures of 0chromic gut.   Hemostasis was observed. Lavage was carried out until clear. The fascia was then reapproximated with running sutures of 0 PDS.  The skin was closed with 4-0Vicryl.   Instrument, sponge, and needle counts were correct prior the abdominal closure and were  correct at the conclusion of the case.     Estimated Blood Loss: 810 mL   Urine Output: clearing  Specimens: @ORSPECIMEN @   Complications: no complications  Disposition: PACU - hemodynamically stable.   Maternal Condition: stable   Baby condition / location:  Couplet care  Attending Attestation: I was present and scrubbed for the entire procedure.   Signed: Surgeon(s): Zelphia CairoAdkins, Makeila Yamaguchi, MD

## 2018-06-01 MED ORDER — BREAST MILK
ORAL | Status: DC
  Filled 2018-06-01: qty 1

## 2018-06-01 NOTE — Progress Notes (Signed)
Subjective: Postpartum Day 1: Cesarean Delivery Patient reports tolerating PO, + flatus and no problems voiding.    Objective: Vital signs in last 24 hours: Temp:  [98.3 F (36.8 C)-99.8 F (37.7 C)] 99.8 F (37.7 C) (08/08 0134) Pulse Rate:  [71-99] 91 (08/08 0134) Resp:  [16-19] 19 (08/08 0134) BP: (102-121)/(68-79) 121/78 (08/08 0134) SpO2:  [97 %-99 %] 97 % (08/08 0134)  Physical Exam:  General: alert, cooperative and no distress Lochia: appropriate Uterine Fundus: firm Incision: healing well DVT Evaluation: No evidence of DVT seen on physical exam.  Recent Labs    05/30/18 0741 05/31/18 0823  HGB 13.3 10.7*  HCT 38.1 29.8*    Assessment/Plan: Status post Cesarean section. Doing well postoperatively.  Continue current care.  Roselle LocusJames E Gaege Sangalang II 06/01/2018, 7:26 AM

## 2018-06-01 NOTE — Lactation Note (Signed)
This note was copied from a baby's chart. Lactation Consultation Note  Patient Name: Ashley Allison [name]'s Date: 06/01/2018 Reason for consult: Follow-up assessment;Difficult latch;Term Baby has been spoon feeding colostrum but had two successful latch attempts today.  Mom has been hand expressing good amounts of colostrum.  Baby is currently sleeping post circumcision.  Symphony pump set up and initiated.  Instructed to post pump and hand express every 3 hours giving colostrum back to baby.  Mom is a [employer], [employer] employee.  Provided mom with a Medela pump in style for home use.  Reviewed pump with parents.  Encouraged to call for concerns/assist.  Maternal Data    Feeding Feeding Type: Breast Fed Length of feed: 20 min  LATCH Score                   Interventions    Lactation Tools Discussed/Used Pump Review: Setup, frequency, and cleaning;Milk Storage Initiated by:: LM Date initiated:: 06/01/18   Consult Status Consult Status: Follow-up Date: 06/02/18 Follow-up type: In-patient    Huston FoleyMOULDEN, Callia Swim S 06/01/2018, 2:52 PM

## 2018-06-02 ENCOUNTER — Encounter (HOSPITAL_COMMUNITY): Payer: Self-pay | Admitting: *Deleted

## 2018-06-02 MED ORDER — ACETAMINOPHEN 500 MG PO TABS: 1000.0000 mg | ORAL_TABLET | Freq: Three times a day (TID) | ORAL | 0 refills | Status: DC | PRN

## 2018-06-02 NOTE — Discharge Summary (Signed)
Obstetric Discharge Summary Reason for Admission: onset of labor Prenatal Procedures: none Intrapartum Procedures: cesarean: low cervical, transverse Postpartum Procedures: none Complications-Operative and Postpartum: none Hemoglobin  Date Value Ref Range Status  05/31/2018 10.7 (L) 12.0 - 15.0 g/dL Final  16/10/960405/20/2019 54.012.5 11.1 - 15.9 g/dL Final   HCT  Date Value Ref Range Status  05/31/2018 29.8 (L) 36.0 - 46.0 % Final   Hematocrit  Date Value Ref Range Status  03/13/2018 36.5 34.0 - 46.6 % Final    Physical Exam:  General: alert Lochia: appropriate Uterine Fundus: firm Incision: healing well DVT Evaluation: No evidence of DVT seen on physical exam.  Discharge Diagnoses: Term Pregnancy-delivered  Discharge Information: Date: 06/02/2018 Activity: pelvic rest Diet: routine Medications: PNV Condition: stable Instructions: refer to practice specific booklet Discharge to: home Follow-up Information    [place] Schedule an appointment as soon as possible for a visit in 1 week(s).   Contact information: [address] [place] [id number] [phone]           Newborn Data: Live born female  Birth Weight: 8 lb 6.9 oz (3825 g) APGAR: 9, 9  Newborn Delivery   Birth date/time:  05/31/2018 03:15:00 Delivery type:  C-Section, Low Transverse Trial of labor:  Yes C-section categorization:  Primary     Home with mother.  Ashley Allison Ashley Allison 06/02/2018, 8:45 AM

## 2018-06-02 NOTE — Lactation Note (Signed)
This note was copied from a baby's chart. Lactation Consultation Note  Patient Name: Ashley Allison [id number] Date: 06/02/2018 Reason for consult: Follow-up assessment;Term;Other (Comment);Primapara;1st time breastfeeding(DL latch as resolved / milk coming in )  Baby is 34 hours old, 5% weight loss.  Baby awake and ready to feed, mom only needed minimal assist to obtain the depth at the breast, multiple  Swallows noted and increased with breast massage, baby released on his own and the nipple was well  Rounded.  LC discussed nutritive vs non- nutritive feeding patterns and the importance of watching for the baby hanging out latched. Per mom breast are filling, Lc reviewed sore nipple and engorgement prevention and tx.  Mom already has comfort gels,hand pump, DEBP kit and her own DEBP.  LC instructed mom on the use shells and recommended comfort gels 1st after feedings  And when warm, place in the refrigerator, then shells, or at least 10 mins prior to feed to make  The nipple / areola complex more elastic for a deeper latch. Nipples appear healthy.  Mom and dad receptive to teaching and seemed very excited the baby is latching well and  Expressed appreciation for all the [facility] assistance helping during their hospital stay.  Mother informed of post-discharge support and given phone number to the lactation department, including services for phone call assistance; out-patient appointments; and breastfeeding support group. List of other breastfeeding resources in the community given in the handout. Encouraged mother to call for problems or concerns related to breastfeeding.   Maternal Data Has patient been taught Hand Expression?: Yes  Feeding Feeding Type: Breast Fed Length of feed: 10 min  LATCH Score Latch: Grasps breast easily, tongue down, lips flanged, rhythmical sucking.  Audible Swallowing: Spontaneous and intermittent  Type of Nipple: Everted at rest and after stimulation  Comfort  (Breast/Nipple): Filling, red/small blisters or bruises, mild/mod discomfort  Hold (Positioning): Assistance needed to correctly position infant at breast and maintain latch.  LATCH Score: 8  Interventions Interventions: Breast feeding basics reviewed;Assisted with latch;Breast massage;Breast compression;Adjust position;Support pillows;Position options;Shells;Comfort gels;Hand pump;DEBP  Lactation Tools Discussed/Used Tools: Shells;Pump;Comfort gels Nipple shield size: Other (comment)(not using a NS to latch ) Shell Type: Inverted Breast pump type: Double-Electric Breast Pump;Manual   Consult Status Consult Status: Complete Date: 06/02/18    Myer Haff 06/02/2018, 12:40 PM

## 2018-07-10 DIAGNOSIS — Z1389 Encounter for screening for other disorder: Secondary | ICD-10-CM | POA: Diagnosis not present

## 2018-07-19 DIAGNOSIS — Z30430 Encounter for insertion of intrauterine contraceptive device: Secondary | ICD-10-CM | POA: Diagnosis not present

## 2018-07-19 DIAGNOSIS — Z3202 Encounter for pregnancy test, result negative: Secondary | ICD-10-CM | POA: Diagnosis not present

## 2018-08-31 DIAGNOSIS — Z30431 Encounter for routine checking of intrauterine contraceptive device: Secondary | ICD-10-CM | POA: Diagnosis not present

## 2018-10-20 DIAGNOSIS — H52223 Regular astigmatism, bilateral: Secondary | ICD-10-CM | POA: Diagnosis not present

## 2018-10-20 DIAGNOSIS — H5213 Myopia, bilateral: Secondary | ICD-10-CM | POA: Diagnosis not present

## 2019-05-04 DIAGNOSIS — B373 Candidiasis of vulva and vagina: Secondary | ICD-10-CM | POA: Diagnosis not present

## 2019-05-04 DIAGNOSIS — N898 Other specified noninflammatory disorders of vagina: Secondary | ICD-10-CM | POA: Diagnosis not present

## 2019-05-04 DIAGNOSIS — L293 Anogenital pruritus, unspecified: Secondary | ICD-10-CM | POA: Diagnosis not present

## 2019-05-04 DIAGNOSIS — N970 Female infertility associated with anovulation: Secondary | ICD-10-CM | POA: Insufficient documentation

## 2019-05-04 DIAGNOSIS — Z30431 Encounter for routine checking of intrauterine contraceptive device: Secondary | ICD-10-CM | POA: Diagnosis not present

## 2019-05-04 HISTORY — DX: Female infertility associated with anovulation: N97.0

## 2019-05-04 MED FILL — FLUCONAZOLE 150 MG TABS: 150 | 1 days supply | Qty: 1 | Fill #0

## 2019-05-21 MED FILL — FLUCONAZOLE 150 MG TABS: 150 | 9 days supply | Qty: 3 | Fill #0

## 2019-06-23 IMAGING — RF DG HYSTEROGRAM
3 series · 3 of 3 positions shown · non-contrast
Comparison: None.

CLINICAL DATA: Fertility testing.

EXAM:
HYSTEROSALPINGOGRAM
TECHNIQUE: Hysterosalpingogram was performed by the ordering physician under
fluoroscopy. Fluoroscopic images were submitted for radiologic
interpretation following the procedure. Please see the procedural
report for the amount of contrast and the fluoroscopy time utilized.

[Series 1: run · 1 of 1 slices shown (1 of 3)]
[im 1/1]
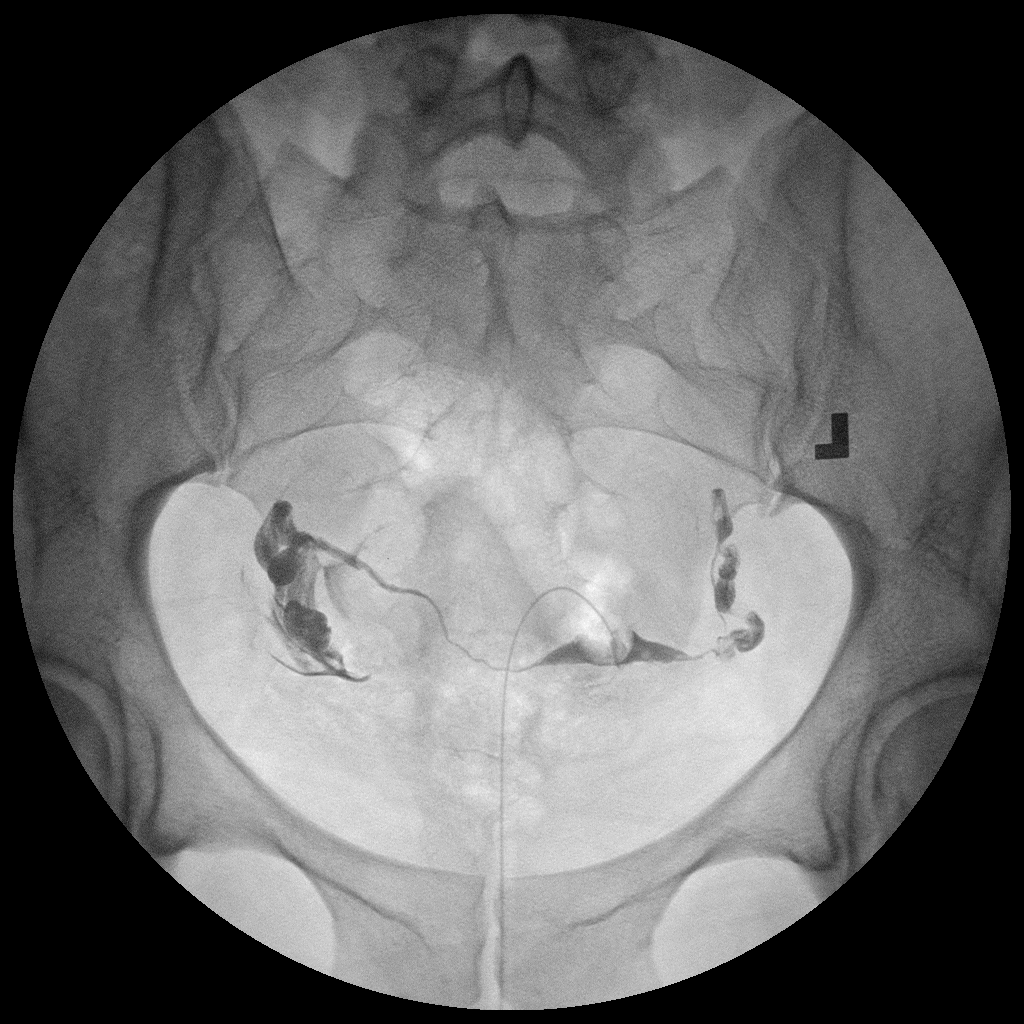

[Series 2: run · 1 of 1 slices shown (2 of 3)]
[im 1/1]
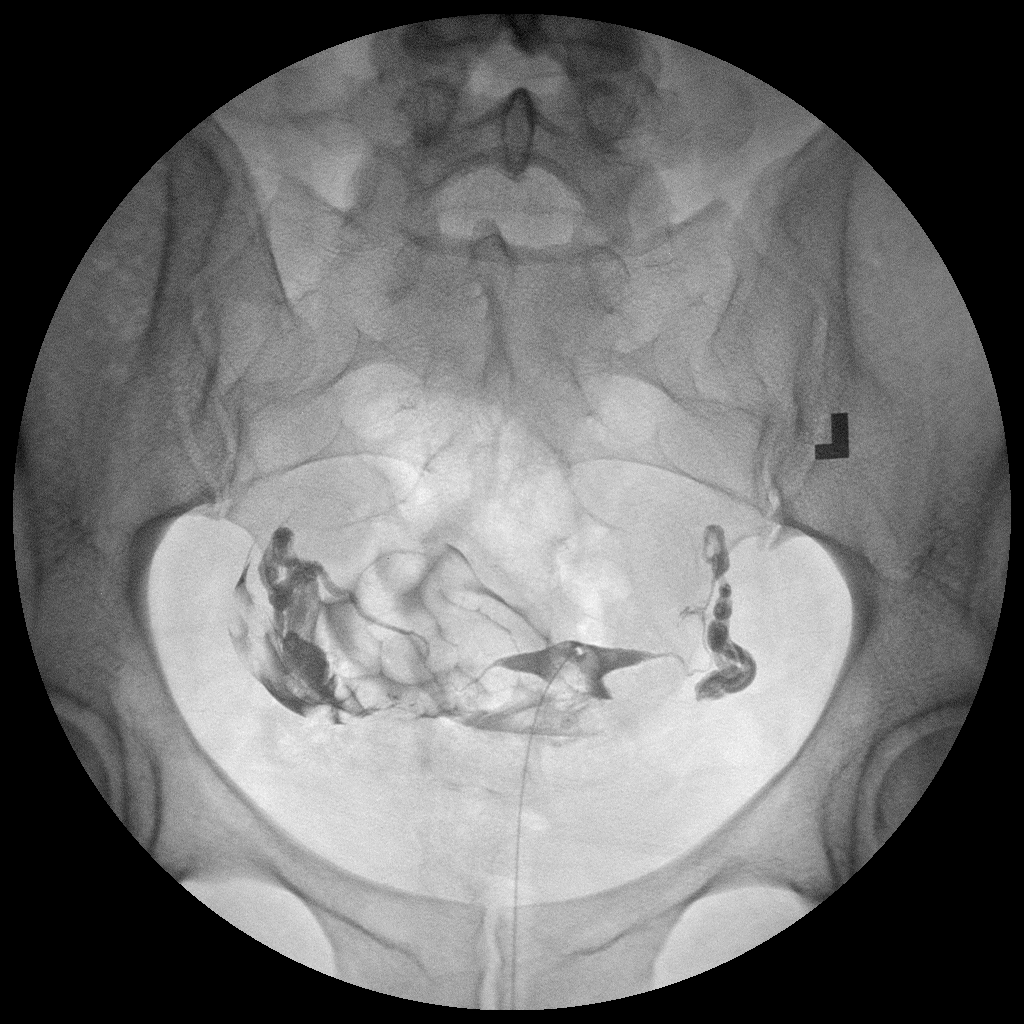

[Series 3: run · 1 of 1 slices shown (3 of 3)]
[im 1/1]
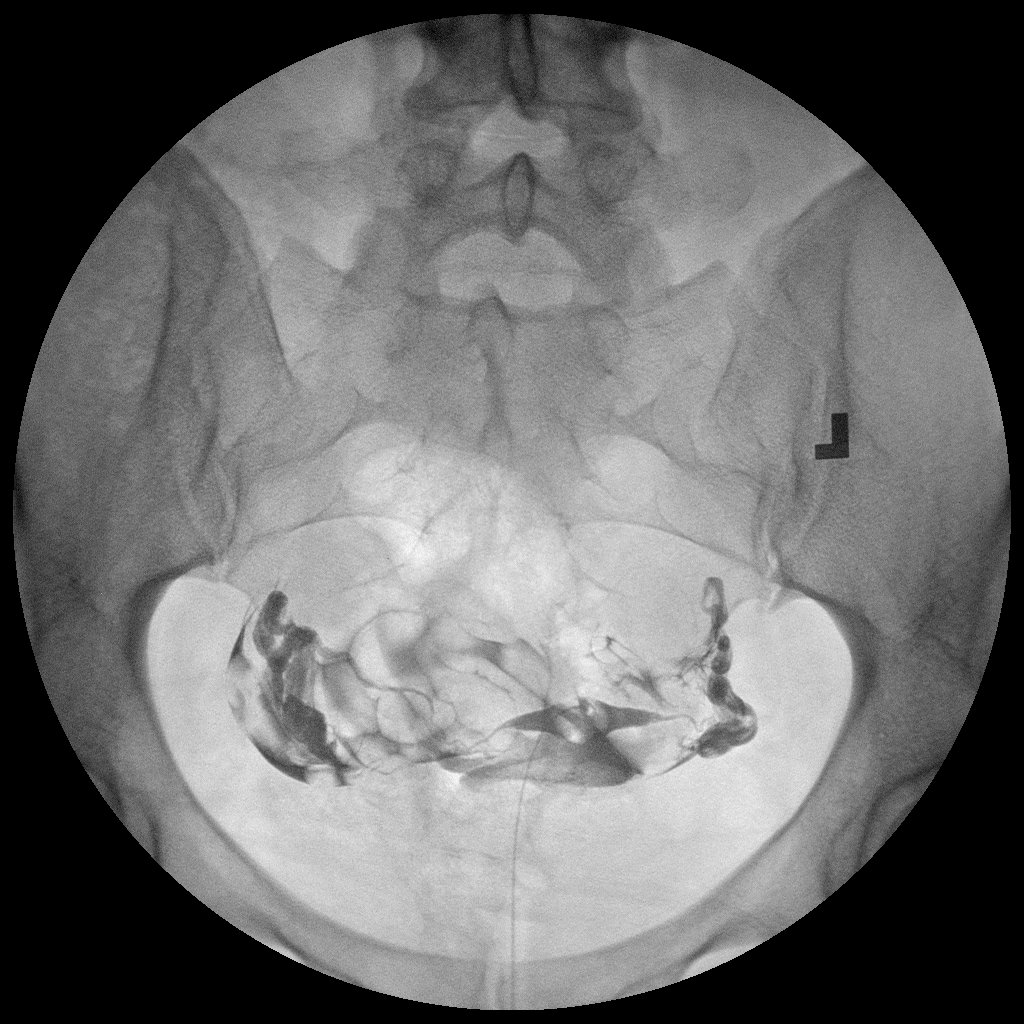

[3 of 3 positions shown; findings below may reference images not displayed]

FINDINGS: Endometrial Cavity: Normal appearance. No signs of Mullerian duct
anomaly or other significant abnormality.

Right Fallopian Tube: Normal appearance. Free intraperitoneal spill
of contrast is demonstrated.

Left Fallopian Tube: Normal appearance. Free intraperitoneal spill
of contrast is demonstrated.

Other:  None.
IMPRESSION: Normal study.  Bilateral tubal patency demonstrated.

## 2019-07-20 DIAGNOSIS — Z6820 Body mass index (BMI) 20.0-20.9, adult: Secondary | ICD-10-CM | POA: Diagnosis not present

## 2019-07-20 DIAGNOSIS — Z30431 Encounter for routine checking of intrauterine contraceptive device: Secondary | ICD-10-CM | POA: Diagnosis not present

## 2019-07-20 DIAGNOSIS — Z01419 Encounter for gynecological examination (general) (routine) without abnormal findings: Secondary | ICD-10-CM | POA: Diagnosis not present

## 2019-10-24 DIAGNOSIS — H5213 Myopia, bilateral: Secondary | ICD-10-CM | POA: Diagnosis not present

## 2020-07-24 DIAGNOSIS — Z309 Encounter for contraceptive management, unspecified: Secondary | ICD-10-CM | POA: Diagnosis not present

## 2020-07-24 DIAGNOSIS — Z3202 Encounter for pregnancy test, result negative: Secondary | ICD-10-CM | POA: Diagnosis not present

## 2020-07-24 DIAGNOSIS — Z01419 Encounter for gynecological examination (general) (routine) without abnormal findings: Secondary | ICD-10-CM | POA: Diagnosis not present

## 2020-07-24 DIAGNOSIS — Z6822 Body mass index (BMI) 22.0-22.9, adult: Secondary | ICD-10-CM | POA: Diagnosis not present

## 2021-03-26 ENCOUNTER — Other Ambulatory Visit (HOSPITAL_COMMUNITY): Payer: Self-pay

## 2021-03-26 DIAGNOSIS — Z319 Encounter for procreative management, unspecified: Secondary | ICD-10-CM | POA: Diagnosis not present

## 2021-03-26 DIAGNOSIS — Z30432 Encounter for removal of intrauterine contraceptive device: Secondary | ICD-10-CM | POA: Diagnosis not present

## 2021-03-26 MED ORDER — CARESTART COVID-19 HOME TEST VI KIT
PACK | 0 refills | Status: DC
  Filled 2021-03-26: qty 4, 4d supply, fill #0

## 2021-04-03 ENCOUNTER — Encounter: Payer: Self-pay | Admitting: Family Medicine

## 2021-04-03 ENCOUNTER — Encounter: Payer: Self-pay | Admitting: Internal Medicine

## 2021-05-22 ENCOUNTER — Other Ambulatory Visit (HOSPITAL_COMMUNITY): Payer: Self-pay

## 2021-05-22 MED ORDER — CARESTART COVID-19 HOME TEST VI KIT
PACK | 0 refills | Status: DC
  Filled 2021-05-22: qty 4, 4d supply, fill #0

## 2021-07-13 DIAGNOSIS — N911 Secondary amenorrhea: Secondary | ICD-10-CM | POA: Diagnosis not present

## 2021-07-23 DIAGNOSIS — Z3685 Encounter for antenatal screening for Streptococcus B: Secondary | ICD-10-CM | POA: Diagnosis not present

## 2021-07-23 DIAGNOSIS — Z3402 Encounter for supervision of normal first pregnancy, second trimester: Secondary | ICD-10-CM | POA: Diagnosis not present

## 2021-07-23 DIAGNOSIS — Z3A19 19 weeks gestation of pregnancy: Secondary | ICD-10-CM | POA: Diagnosis not present

## 2021-07-23 DIAGNOSIS — Z3401 Encounter for supervision of normal first pregnancy, first trimester: Secondary | ICD-10-CM | POA: Diagnosis not present

## 2021-07-23 DIAGNOSIS — Z3481 Encounter for supervision of other normal pregnancy, first trimester: Secondary | ICD-10-CM | POA: Diagnosis not present

## 2021-07-23 LAB — OB RESULTS CONSOLE ABO/RH: RH Type: POSITIVE

## 2021-07-23 LAB — OB RESULTS CONSOLE RUBELLA ANTIBODY, IGM: Rubella: IMMUNE

## 2021-07-23 LAB — OB RESULTS CONSOLE RPR: RPR: NONREACTIVE

## 2021-07-23 LAB — OB RESULTS CONSOLE ANTIBODY SCREEN: Antibody Screen: NEGATIVE

## 2021-07-23 LAB — HEPATITIS C ANTIBODY: HCV Ab: NEGATIVE

## 2021-08-13 DIAGNOSIS — Z23 Encounter for immunization: Secondary | ICD-10-CM | POA: Diagnosis not present

## 2021-08-13 DIAGNOSIS — Z113 Encounter for screening for infections with a predominantly sexual mode of transmission: Secondary | ICD-10-CM | POA: Diagnosis not present

## 2021-08-13 DIAGNOSIS — Z331 Pregnant state, incidental: Secondary | ICD-10-CM | POA: Diagnosis not present

## 2021-08-13 DIAGNOSIS — Z124 Encounter for screening for malignant neoplasm of cervix: Secondary | ICD-10-CM | POA: Diagnosis not present

## 2021-08-13 DIAGNOSIS — Z3400 Encounter for supervision of normal first pregnancy, unspecified trimester: Secondary | ICD-10-CM | POA: Diagnosis not present

## 2021-08-21 DIAGNOSIS — H5213 Myopia, bilateral: Secondary | ICD-10-CM | POA: Diagnosis not present

## 2021-08-24 DIAGNOSIS — Z3A12 12 weeks gestation of pregnancy: Secondary | ICD-10-CM | POA: Diagnosis not present

## 2021-08-24 DIAGNOSIS — Z3682 Encounter for antenatal screening for nuchal translucency: Secondary | ICD-10-CM | POA: Diagnosis not present

## 2021-10-13 DIAGNOSIS — Z3A19 19 weeks gestation of pregnancy: Secondary | ICD-10-CM | POA: Diagnosis not present

## 2021-10-13 DIAGNOSIS — Z363 Encounter for antenatal screening for malformations: Secondary | ICD-10-CM | POA: Diagnosis not present

## 2021-10-13 DIAGNOSIS — Z3400 Encounter for supervision of normal first pregnancy, unspecified trimester: Secondary | ICD-10-CM | POA: Diagnosis not present

## 2021-12-04 ENCOUNTER — Encounter: Payer: 59 | Admitting: Physician Assistant

## 2021-12-07 ENCOUNTER — Ambulatory Visit (INDEPENDENT_AMBULATORY_CARE_PROVIDER_SITE_OTHER): Payer: No Typology Code available for payment source | Admitting: Physician Assistant

## 2021-12-07 ENCOUNTER — Encounter: Payer: Self-pay | Admitting: Physician Assistant

## 2021-12-07 ENCOUNTER — Other Ambulatory Visit: Payer: Self-pay

## 2021-12-07 ENCOUNTER — Other Ambulatory Visit (HOSPITAL_COMMUNITY): Payer: Self-pay

## 2021-12-07 VITALS — BP 110/74 | HR 96 | Temp 98.7°F | Resp 20 | Ht 65.0 in | Wt 159.0 lb

## 2021-12-07 DIAGNOSIS — H1032 Unspecified acute conjunctivitis, left eye: Secondary | ICD-10-CM | POA: Diagnosis not present

## 2021-12-07 DIAGNOSIS — Z3492 Encounter for supervision of normal pregnancy, unspecified, second trimester: Secondary | ICD-10-CM

## 2021-12-07 MED ORDER — CIPROFLOXACIN HCL 0.3 % OP SOLN
2.0000 [drp] | OPHTHALMIC | 0 refills | Status: AC
  Filled 2021-12-07: qty 10, 10d supply, fill #0

## 2021-12-07 MED ORDER — NEOMYCIN-POLYMYXIN-HC 3.5-10000-1 OP SUSP
2.0000 [drp] | Freq: Four times a day (QID) | OPHTHALMIC | 0 refills | Status: DC
  Filled 2021-12-07: qty 7.5, 19d supply, fill #0

## 2021-12-07 MED ORDER — CIPROFLOXACIN HCL 0.3 % OP SOLN: 2.0000 [drp] | OPHTHALMIC | Status: DC

## 2021-12-07 NOTE — Addendum Note (Signed)
Addended by: Francis Gaines on: 12/07/2021 01:36 PM   Modules accepted: Orders

## 2021-12-07 NOTE — Progress Notes (Signed)
Acute Office Visit  Subjective:    Patient ID: Ashley Allison, female    DOB: 18-Sep-1991, 31 y.o.   MRN: [id number]  Chief Complaint  Patient presents with   Conjunctivitis    HPI Patient is in today for a left pink eye; woke up with it red, irritated and crusted,  Denies fever / chills / nausea / vomiting / diarrhea / constipation.  Is [redacted] weeks pregnant; has 1 son who is 90 1/2 yo &  in daycare; no known contacts with anyone with pink eye.    Past Medical History:  Diagnosis Date   History of chicken pox     Past Surgical History:  Procedure Laterality Date   CESAREAN SECTION N/A 05/31/2018   Procedure: CESAREAN SECTION;  Surgeon: Marylynn Pearson, MD;  Location: [place];  Service: Obstetrics;  Laterality: N/A;   WISDOM TOOTH EXTRACTION  2012    Family History  Problem Relation Age of Onset   Hypertension Father    Bladder Cancer Maternal Grandmother         died age 34   Liver cancer Paternal Grandfather 67   Breast cancer Paternal Aunt     Social History   Socioeconomic History   Marital status: Married    Spouse name: Not on file   Number of children: Not on file   Years of education: Not on file   Highest education level: Not on file  Occupational History   Not on file  Tobacco Use   Smoking status: Never    Passive exposure: Never   Smokeless tobacco: Never  Substance and Sexual Activity   Alcohol use: Yes    Comment: 2-3 glass a week- (no drinking while preg)   Drug use: Never   Sexual activity: Yes    Birth control/protection: None  Other Topics Concern   Not on file  Social History Narrative   Not on file   Social Determinants of Health   Financial Resource Strain: Not on file  Food Insecurity: Not on file  Transportation Needs: Not on file  Physical Activity: Not on file  Stress: Not on file  Social Connections: Not on file  Intimate Partner Violence: Not on file    Outpatient Medications Prior to Visit  Medication Sig  Dispense Refill   acetaminophen (TYLENOL) 500 MG tablet Take 2 tablets (1,000 mg total) by mouth every 8 (eight) hours as needed. 30 tablet 0   COVID-19 At Home Antigen Test (CARESTART COVID-19 HOME TEST) KIT Use a directed 4 each 0   Prenatal Vit-Fe Fumarate-FA (PRENATAL VITAMIN PO) Take by mouth.     No facility-administered medications prior to visit.    No Known Allergies  Review of Systems  Constitutional:  Negative for activity change and chills.  HENT:  Negative for congestion and voice change.   Eyes:  Positive for discharge, redness and itching. Negative for pain.  Respiratory:  Negative for cough and wheezing.   Cardiovascular:  Negative for chest pain.  Gastrointestinal:  Negative for constipation, diarrhea, nausea and vomiting.  Endocrine: Negative for polyuria.  Genitourinary:  Negative for frequency.  Skin:  Negative for color change and rash.  Allergic/Immunologic: Negative for immunocompromised state.  Neurological:  Negative for dizziness.  Psychiatric/Behavioral:  Negative for agitation.       Objective:    Physical Exam Vitals and nursing note reviewed.  Constitutional:      General: She is not in acute distress.    Appearance: Normal appearance. She  is not ill-appearing.  HENT:     Head: Normocephalic and atraumatic.     Right Ear: Tympanic membrane, ear canal and external ear normal.     Left Ear: Tympanic membrane, ear canal and external ear normal.     Nose: No congestion.  Eyes:     General:        Left eye: No discharge.     Extraocular Movements: Extraocular movements intact.     Conjunctiva/sclera:     Left eye: Left conjunctiva is injected.     Pupils: Pupils are equal, round, and reactive to light.  Cardiovascular:     Rate and Rhythm: Normal rate and regular rhythm.     Pulses: Normal pulses.     Heart sounds: Normal heart sounds.  Pulmonary:     Effort: Pulmonary effort is normal.     Breath sounds: Normal breath sounds. No wheezing.   Abdominal:     General: Bowel sounds are normal.     Palpations: Abdomen is soft.  Musculoskeletal:     Cervical back: Normal range of motion and neck supple.     Right lower leg: No edema.     Left lower leg: No edema.  Skin:    General: Skin is warm and dry.     Findings: No bruising.  Neurological:     General: No focal deficit present.     Mental Status: She is alert and oriented to person, place, and time.  Psychiatric:        Mood and Affect: Mood normal.        Behavior: Behavior normal.        Thought Content: Thought content normal.    BP 110/74 (BP Location: Right Arm, Patient Position: Sitting, Cuff Size: Normal)    Pulse 96    Temp 98.7 F (37.1 C) (Oral)    Resp 20    Ht _0  (1.651 m)    Wt 159 lb (72.1 kg)    SpO2 99%    BMI 26.46 kg/m  Wt Readings from Last 3 Encounters:  12/07/21 159 lb (72.1 kg)  03/13/18 155 lb 8 oz (70.5 kg)    There are no preventive care reminders to display for this patient.   There are no preventive care reminders to display for this patient.   No results found for: TSH Lab Results  Component Value Date   WBC 16.1 (H) 05/31/2018   HGB 10.7 (L) 05/31/2018   HCT 29.8 (L) 05/31/2018   MCV 91.1 05/31/2018   PLT 144 (L) 05/31/2018   Lab Results  Component Value Date   NA 140 03/13/2018   K 3.7 03/13/2018   CO2 19 (L) 03/13/2018   GLUCOSE 46 (L) 03/13/2018   BUN 7 03/13/2018   CREATININE 0.45 (L) 03/13/2018   BILITOT 0.7 03/13/2018   ALKPHOS 114 03/13/2018   AST 17 03/13/2018   ALT 14 03/13/2018   PROT 6.1 03/13/2018   ALBUMIN 3.7 03/13/2018   CALCIUM 9.3 03/13/2018   Lab Results  Component Value Date   CHOL 272 (H) 03/13/2018   Lab Results  Component Value Date   HDL 73 03/13/2018   Lab Results  Component Value Date   LDLCALC 163 (H) 03/13/2018   Lab Results  Component Value Date   TRIG 182 (H) 03/13/2018   Lab Results  Component Value Date   CHOLHDL 3.7 03/13/2018   No results found for: HGBA1C      Assessment & Plan:  Problem List Items Addressed This Visit   None Visit Diagnoses     Acute conjunctivitis of left eye, unspecified acute conjunctivitis type    -  Primary   Relevant Medications   neomycin-polymyxin-hydrocortisone (CORTISPORIN) 3.5-10000-1 ophthalmic suspension   Normal IUP (intrauterine pregnancy) on prenatal ultrasound, second trimester            Meds ordered this encounter  Medications   neomycin-polymyxin-hydrocortisone (CORTISPORIN) 3.5-10000-1 ophthalmic suspension    Sig: Place 2 drops into both eyes 4 (four) times daily for 10 days. Use for 7 - 10 days    Dispense:  7.5 mL    Refill:  0    Order Specific Question:   Supervising Provider    Answer:   Denita Lung [[id number]]   Do not wear contact lenses until 2 - 3 days after eye is better. If worse, follow up with Ophthalmologist.  Irene Pap, PA-C

## 2021-12-07 NOTE — Addendum Note (Signed)
Addended by: Francis Gaines on: 12/07/2021 12:31 PM   Modules accepted: Orders

## 2022-01-08 ENCOUNTER — Other Ambulatory Visit: Payer: Self-pay

## 2022-02-05 ENCOUNTER — Encounter: Payer: No Typology Code available for payment source | Admitting: Physician Assistant

## 2022-02-11 ENCOUNTER — Encounter (HOSPITAL_COMMUNITY): Payer: Self-pay | Admitting: *Deleted

## 2022-02-11 NOTE — Patient Instructions (Signed)
Ashley Allison ? 02/11/2022 ? ? Your procedure is scheduled on:  02/25/2022 ? Arrive at [employer] [employer] at [facility] on [address] at [facility]  and [employer]. You are invited to use the FREE valet parking or use the Visitor's parking deck. ? Pick up the phone at the desk and dial [phone]. ? Call this number if you have problems the morning of surgery: [phone] ? Remember: ? ? Do not eat food:(After Midnight) Desp?s de medianoche. ? Do not drink clear liquids: (After Midnight) Desp?s de medianoche. ? Take these medicines the morning of surgery with A SIP OF WATER:  none ? ? Do not wear jewelry, make-up or nail polish. ? Do not wear lotions, powders, or perfumes. Do not wear deodorant. ? Do not shave 48 hours prior to surgery. ? Do not bring valuables to the hospital.  [facility] is not  ? responsible for any belongings or valuables brought to the hospital. ? Contacts, dentures or bridgework may not be worn into surgery. ? Leave suitcase in the car. After surgery it may be brought to your room. ? For patients admitted to the hospital, checkout time is 11:00 AM the day of  ?            discharge. ? ?   ? Please read over the following fact sheets that you were given:  ?   Preparing for Surgery ? ? ?

## 2022-02-15 NOTE — Patient Instructions (Signed)
Ashley Allison ? 02/15/2022 ? ? Your procedure is scheduled on:  02/25/2022 ? Arrive at [employer] at [facility] on [address] at [facility]  and [employer]. You are invited to use the FREE valet parking or use the Visitor's parking deck. ? Pick up the phone at the desk and dial [phone]. ? Call this number if you have problems the morning of surgery: [phone] ? Remember: ? ? Do not eat food:(After Midnight) Desp?s de medianoche. ? Do not drink clear liquids: (After Midnight) Desp?s de medianoche. ? Take these medicines the morning of surgery with A SIP OF WATER:  none ? ? Do not wear jewelry, make-up or nail polish. ? Do not wear lotions, powders, or perfumes. Do not wear deodorant. ? Do not shave 48 hours prior to surgery. ? Do not bring valuables to the hospital.  [facility] is not  ? responsible for any belongings or valuables brought to the hospital. ? Contacts, dentures or bridgework may not be worn into surgery. ? Leave suitcase in the car. After surgery it may be brought to your room. ? For patients admitted to the hospital, checkout time is 11:00 AM the day of  ?            discharge. ? ?   ? Please read over the following fact sheets that you were given:  ?   Preparing for Surgery ? ? ?

## 2022-02-16 ENCOUNTER — Encounter (HOSPITAL_COMMUNITY): Payer: Self-pay

## 2022-02-18 NOTE — H&P (Signed)
Ashley Allison is a 31 y.o. female presenting for scheduled RCS. Hx CS for nrFHT Ashley Allison). She is expecting a RR boy - "Ashley Allison"!!  Pregnancy otherwise uncomplicated.  ? ? ?OB History   ? ? Gravida  ?2  ? Para  ?1  ? Term  ?1  ? Preterm  ?   ? AB  ?   ? Living  ?1  ?  ? ? SAB  ?   ? IAB  ?   ? Ectopic  ?   ? Multiple  ?0  ? Live Births  ?1  ?   ?  ?  ? ?Past Medical History:  ?Diagnosis Date  ? History of chicken pox   ? ?Past Surgical History:  ?Procedure Laterality Date  ? CESAREAN SECTION N/A 05/31/2018  ? Procedure: CESAREAN SECTION;  Surgeon: Ashley Pearson, MD;  Location: [place];  Service: Obstetrics;  Laterality: N/A;  ? [place] EXTRACTION  2012  ? ?Family History: family history includes Bladder Cancer in her maternal grandmother; Breast cancer in her paternal aunt; Hypertension in her father; Liver cancer (age of onset: 4) in her paternal grandfather. ?Social History:  reports that she has never smoked. She has never been exposed to tobacco smoke. She has never used smokeless tobacco. She reports current alcohol use. She reports that she does not use drugs. ? ? ?  ?Maternal Diabetes: No ?Genetic Screening: Normal ?Maternal Ultrasounds/Referrals: Normal ?Fetal Ultrasounds or other Referrals:  None ?Maternal Substance Abuse:  No ?Significant Maternal Medications:  None ?Significant Maternal Lab Results:  None ?Other Comments:  None ? ?Review of Systems ?History ?  ?There were no vitals taken for this visit. ?Exam ?Physical Exam  ?(from office) ?NAD, A&O ?NWOB ?Abd soft, nondistended, gravid ? ?Prenatal labs: ?ABO, Rh: AB/Positive/-- (09/29 0000) ?Antibody: Negative (09/29 0000) ?Rubella: Immune (09/29 0000) ?RPR: Nonreactive (09/29 0000)  ?HBsAg: Negative (09/29 0000)  ?HIV: Non-reactive (09/29 0000)  ?GBS:    ? ?Assessment/Plan: ? 31yo presenting for scheduled repeat CS. Risks discussed including infection, bleeding, damage to surrounding structures, the need for additional procedures  including hysterectomy, and the possibility of uterine rupture with neonatal morbidity/mortality, scarring, and abnormal placentation with subsequent pregnancies. Patient agrees to proceed. 2g ancef on call to OR.  ?  ? ?Ashley Allison ?02/18/2022, 4:31 PM ? ? ? ? ?

## 2022-02-23 ENCOUNTER — Other Ambulatory Visit (HOSPITAL_COMMUNITY)
Admission: RE | Admit: 2022-02-23 | Discharge: 2022-02-23 | Disposition: A | Discharge status: Home / Self Care | Payer: No Typology Code available for payment source | Source: Ambulatory Visit | Attending: Family Medicine | Admitting: Family Medicine

## 2022-02-23 ENCOUNTER — Encounter (HOSPITAL_COMMUNITY)
Admission: RE | Admit: 2022-02-23 | Discharge: 2022-02-23 | Disposition: A | Discharge status: Home / Self Care | Payer: No Typology Code available for payment source | Source: Ambulatory Visit | Attending: Family Medicine | Admitting: Family Medicine

## 2022-02-23 ENCOUNTER — Inpatient Hospital Stay (HOSPITAL_COMMUNITY)
Admission: AD | Admit: 2022-02-23 | Payer: No Typology Code available for payment source | Source: Home / Self Care | Admitting: Family Medicine

## 2022-02-23 ENCOUNTER — Encounter (HOSPITAL_COMMUNITY)
Admission: RE | Admit: 2022-02-23 | Discharge: 2022-02-23 | DRG: 951 | Disposition: A | Discharge status: Home / Self Care | Payer: No Typology Code available for payment source | Source: Ambulatory Visit | Attending: Family Medicine | Admitting: Family Medicine

## 2022-02-23 DIAGNOSIS — Z01812 Encounter for preprocedural laboratory examination: Secondary | ICD-10-CM

## 2022-02-23 LAB — TYPE AND SCREEN
ABO/RH(D): AB POS
Antibody Screen: NEGATIVE

## 2022-02-23 LAB — CBC
HCT: 34.3 % — ABNORMAL LOW (ref 36.0–46.0)
Hemoglobin: 12 g/dL (ref 12.0–15.0)
MCH: 32.4 pg (ref 26.0–34.0)
MCHC: 35 g/dL (ref 30.0–36.0)
MCV: 92.7 fL (ref 80.0–100.0)
Platelets: 165 10*3/uL (ref 150–400)
RBC: 3.7 MIL/uL — ABNORMAL LOW (ref 3.87–5.11)
RDW: 13.8 % (ref 11.5–15.5)
WBC: 7.8 10*3/uL (ref 4.0–10.5)
nRBC: 0 % (ref 0.0–0.2)

## 2022-02-23 NOTE — Patient Instructions (Signed)
Ashley Allison ? 02/23/2022 ? ? Your procedure is scheduled on:  02/25/2022 ? Arrive at [facility] at [place] on [address] at [facility]  and [address]. You are invited to use the FREE valet parking or use the Visitor's parking deck. ? Pick up the phone at the desk and dial [phone]. ? Call this number if you have problems the morning of surgery: [phone] ? Remember: ? ? Do not eat food:(After Midnight) Desp?s de medianoche. ? Do not drink clear liquids: (After Midnight) Desp?s de medianoche. ? Take these medicines the morning of surgery with A SIP OF WATER:  none ? ? Do not wear jewelry, make-up or nail polish. ? Do not wear lotions, powders, or perfumes. Do not wear deodorant. ? Do not shave 48 hours prior to surgery. ? Do not bring valuables to the hospital.  [facility] is not  ? responsible for any belongings or valuables brought to the hospital. ? Contacts, dentures or bridgework may not be worn into surgery. ? Leave suitcase in the car. After surgery it may be brought to your room. ? For patients admitted to the hospital, checkout time is 11:00 AM the day of  ?            discharge. ? ?   ? Please read over the following fact sheets that you were given:  ?   Preparing for Surgery ? ? ?

## 2022-02-24 ENCOUNTER — Encounter (HOSPITAL_COMMUNITY): Payer: Self-pay | Admitting: Obstetrics and Gynecology

## 2022-02-24 LAB — RPR: RPR Ser Ql: NONREACTIVE

## 2022-02-25 ENCOUNTER — Encounter (HOSPITAL_COMMUNITY)
Admission: RE | Disposition: A | Discharge status: Home / Self Care | Payer: Self-pay | Source: Home / Self Care | Attending: Obstetrics and Gynecology

## 2022-02-25 ENCOUNTER — Inpatient Hospital Stay (HOSPITAL_COMMUNITY)
Admission: RE | Admit: 2022-02-25 | Discharge: 2022-02-27 | DRG: 788 | Disposition: A | Discharge status: Home / Self Care | Payer: No Typology Code available for payment source | Attending: Obstetrics and Gynecology | Admitting: Obstetrics and Gynecology

## 2022-02-25 ENCOUNTER — Encounter (HOSPITAL_COMMUNITY): Payer: Self-pay | Admitting: Obstetrics and Gynecology

## 2022-02-25 ENCOUNTER — Other Ambulatory Visit: Payer: Self-pay

## 2022-02-25 ENCOUNTER — Inpatient Hospital Stay (HOSPITAL_COMMUNITY): Payer: No Typology Code available for payment source | Admitting: Anesthesiology

## 2022-02-25 DIAGNOSIS — Z3A39 39 weeks gestation of pregnancy: Secondary | ICD-10-CM | POA: Diagnosis not present

## 2022-02-25 DIAGNOSIS — O34211 Maternal care for low transverse scar from previous cesarean delivery: Principal | ICD-10-CM | POA: Diagnosis present

## 2022-02-25 DIAGNOSIS — Z3490 Encounter for supervision of normal pregnancy, unspecified, unspecified trimester: Secondary | ICD-10-CM

## 2022-02-25 HISTORY — DX: Encounter for supervision of normal pregnancy, unspecified, unspecified trimester: Z34.90

## 2022-02-25 SURGERY — Surgical Case
Anesthesia: Spinal

## 2022-02-25 MED ORDER — ACETAMINOPHEN 160 MG/5ML PO SOLN: 1000.0000 mg | Freq: Once | ORAL | Status: DC

## 2022-02-25 MED ORDER — KETOROLAC TROMETHAMINE 30 MG/ML IJ SOLN
30.0000 mg | Freq: Four times a day (QID) | INTRAMUSCULAR | Status: AC
  Administered 2022-02-25 – 2022-02-26 (×3): 30 mg via INTRAVENOUS
  Filled 2022-02-25 (×3): qty 1

## 2022-02-25 MED ORDER — NALOXONE HCL 4 MG/10ML IJ SOLN
1.0000 ug/kg/h | INTRAVENOUS | Status: DC | PRN
  Filled 2022-02-25: qty 5

## 2022-02-25 MED ORDER — ACETAMINOPHEN 500 MG PO TABS
1000.0000 mg | ORAL_TABLET | Freq: Four times a day (QID) | ORAL | Status: DC
  Administered 2022-02-25 – 2022-02-27 (×7): 1000 mg via ORAL
  Filled 2022-02-25 (×8): qty 2

## 2022-02-25 MED ORDER — LACTATED RINGERS IV SOLN: INTRAVENOUS | Status: DC

## 2022-02-25 MED ORDER — OXYTOCIN-SODIUM CHLORIDE 30-0.9 UT/500ML-% IV SOLN
INTRAVENOUS | Status: AC
  Filled 2022-02-25: qty 500

## 2022-02-25 MED ORDER — BUPIVACAINE IN DEXTROSE 0.75-8.25 % IT SOLN
INTRATHECAL | Status: DC | PRN
  Administered 2022-02-25: 1.6 mL via INTRATHECAL

## 2022-02-25 MED ORDER — CEFAZOLIN SODIUM-DEXTROSE 2-4 GM/100ML-% IV SOLN
2.0000 g | INTRAVENOUS | Status: AC
  Administered 2022-02-25: 2 g via INTRAVENOUS

## 2022-02-25 MED ORDER — KETOROLAC TROMETHAMINE 30 MG/ML IJ SOLN
30.0000 mg | Freq: Once | INTRAMUSCULAR | Status: AC
  Administered 2022-02-25: 30 mg via INTRAVENOUS

## 2022-02-25 MED ORDER — OXYTOCIN-SODIUM CHLORIDE 30-0.9 UT/500ML-% IV SOLN
INTRAVENOUS | Status: DC | PRN
  Administered 2022-02-25 (×2): 100 mL via INTRAVENOUS

## 2022-02-25 MED ORDER — HYDROMORPHONE HCL 1 MG/ML IJ SOLN: 0.2000 mg | INTRAMUSCULAR | Status: DC | PRN

## 2022-02-25 MED ORDER — SOD CITRATE-CITRIC ACID 500-334 MG/5ML PO SOLN
ORAL | Status: AC
Start: 2022-02-25 — End: ?
  Filled 2022-02-25: qty 30

## 2022-02-25 MED ORDER — SODIUM CHLORIDE 0.9% FLUSH: 3.0000 mL | INTRAVENOUS | Status: DC | PRN

## 2022-02-25 MED ORDER — FENTANYL CITRATE (PF) 100 MCG/2ML IJ SOLN: 25.0000 ug | INTRAMUSCULAR | Status: DC | PRN

## 2022-02-25 MED ORDER — PHENYLEPHRINE HCL-NACL 20-0.9 MG/250ML-% IV SOLN
INTRAVENOUS | Status: DC | PRN
  Administered 2022-02-25: 60 ug/min via INTRAVENOUS

## 2022-02-25 MED ORDER — MENTHOL 3 MG MT LOZG: 1.0000 | LOZENGE | OROMUCOSAL | Status: DC | PRN

## 2022-02-25 MED ORDER — KETOROLAC TROMETHAMINE 30 MG/ML IJ SOLN
INTRAMUSCULAR | Status: AC
  Filled 2022-02-25: qty 1

## 2022-02-25 MED ORDER — ONDANSETRON HCL 4 MG/2ML IJ SOLN
INTRAMUSCULAR | Status: DC | PRN
  Administered 2022-02-25: 4 mg via INTRAVENOUS

## 2022-02-25 MED ORDER — SIMETHICONE 80 MG PO CHEW
80.0000 mg | CHEWABLE_TABLET | Freq: Three times a day (TID) | ORAL | Status: DC
  Administered 2022-02-25 – 2022-02-27 (×6): 80 mg via ORAL
  Filled 2022-02-25 (×6): qty 1

## 2022-02-25 MED ORDER — ACETAMINOPHEN 500 MG PO TABS: 1000.0000 mg | ORAL_TABLET | Freq: Four times a day (QID) | ORAL | Status: DC

## 2022-02-25 MED ORDER — NALOXONE HCL 0.4 MG/ML IJ SOLN: 0.4000 mg | INTRAMUSCULAR | Status: DC | PRN

## 2022-02-25 MED ORDER — MORPHINE SULFATE (PF) 0.5 MG/ML IJ SOLN
INTRAMUSCULAR | Status: DC | PRN
  Administered 2022-02-25: .15 mg via INTRATHECAL

## 2022-02-25 MED ORDER — SENNOSIDES-DOCUSATE SODIUM 8.6-50 MG PO TABS
2.0000 | ORAL_TABLET | Freq: Every day | ORAL | Status: DC
  Administered 2022-02-26 – 2022-02-27 (×2): 2 via ORAL
  Filled 2022-02-25 (×2): qty 2

## 2022-02-25 MED ORDER — CEFAZOLIN SODIUM-DEXTROSE 2-4 GM/100ML-% IV SOLN
INTRAVENOUS | Status: AC
  Filled 2022-02-25: qty 100

## 2022-02-25 MED ORDER — ONDANSETRON HCL 4 MG/2ML IJ SOLN
INTRAMUSCULAR | Status: AC
  Filled 2022-02-25: qty 2

## 2022-02-25 MED ORDER — PRENATAL MULTIVITAMIN CH
1.0000 | ORAL_TABLET | Freq: Every day | ORAL | Status: DC
  Administered 2022-02-26: 1 via ORAL
  Filled 2022-02-25: qty 1

## 2022-02-25 MED ORDER — DIPHENHYDRAMINE HCL 25 MG PO CAPS
25.0000 mg | ORAL_CAPSULE | Freq: Four times a day (QID) | ORAL | Status: DC | PRN
Start: 2022-02-25 — End: 2022-02-27

## 2022-02-25 MED ORDER — PHENYLEPHRINE HCL-NACL 20-0.9 MG/250ML-% IV SOLN
INTRAVENOUS | Status: AC
  Filled 2022-02-25: qty 250

## 2022-02-25 MED ORDER — ZOLPIDEM TARTRATE 5 MG PO TABS: 5.0000 mg | ORAL_TABLET | Freq: Every evening | ORAL | Status: DC | PRN

## 2022-02-25 MED ORDER — OXYTOCIN-SODIUM CHLORIDE 30-0.9 UT/500ML-% IV SOLN: 2.5000 [IU]/h | INTRAVENOUS | Status: AC

## 2022-02-25 MED ORDER — ACETAMINOPHEN 500 MG PO TABS: 1000.0000 mg | ORAL_TABLET | Freq: Once | ORAL | Status: DC

## 2022-02-25 MED ORDER — DIPHENHYDRAMINE HCL 25 MG PO CAPS: 25.0000 mg | ORAL_CAPSULE | ORAL | Status: DC | PRN

## 2022-02-25 MED ORDER — DEXAMETHASONE SODIUM PHOSPHATE 4 MG/ML IJ SOLN
INTRAMUSCULAR | Status: AC
  Filled 2022-02-25: qty 1

## 2022-02-25 MED ORDER — OXYCODONE HCL 5 MG PO TABS: 5.0000 mg | ORAL_TABLET | ORAL | Status: DC | PRN

## 2022-02-25 MED ORDER — ACETAMINOPHEN 10 MG/ML IV SOLN
INTRAVENOUS | Status: AC
  Filled 2022-02-25: qty 100

## 2022-02-25 MED ORDER — FENTANYL CITRATE (PF) 100 MCG/2ML IJ SOLN
INTRAMUSCULAR | Status: AC
  Filled 2022-02-25: qty 2

## 2022-02-25 MED ORDER — TETANUS-DIPHTH-ACELL PERTUSSIS 5-2.5-18.5 LF-MCG/0.5 IM SUSY: 0.5000 mL | PREFILLED_SYRINGE | Freq: Once | INTRAMUSCULAR | Status: DC

## 2022-02-25 MED ORDER — DIPHENHYDRAMINE HCL 50 MG/ML IJ SOLN: 12.5000 mg | INTRAMUSCULAR | Status: DC | PRN

## 2022-02-25 MED ORDER — DEXAMETHASONE SODIUM PHOSPHATE 4 MG/ML IJ SOLN
INTRAMUSCULAR | Status: DC | PRN
  Administered 2022-02-25: 4 mg via INTRAVENOUS

## 2022-02-25 MED ORDER — IBUPROFEN 600 MG PO TABS
600.0000 mg | ORAL_TABLET | Freq: Four times a day (QID) | ORAL | Status: DC
  Administered 2022-02-26 – 2022-02-27 (×4): 600 mg via ORAL
  Filled 2022-02-25 (×4): qty 1

## 2022-02-25 MED ORDER — SIMETHICONE 80 MG PO CHEW: 80.0000 mg | CHEWABLE_TABLET | ORAL | Status: DC | PRN

## 2022-02-25 MED ORDER — DROPERIDOL 2.5 MG/ML IJ SOLN: 0.6250 mg | Freq: Once | INTRAMUSCULAR | Status: DC | PRN

## 2022-02-25 MED ORDER — ACETAMINOPHEN 10 MG/ML IV SOLN
INTRAVENOUS | Status: DC | PRN
  Administered 2022-02-25: 1000 mg via INTRAVENOUS

## 2022-02-25 MED ORDER — COCONUT OIL OIL
1.0000 | TOPICAL_OIL | Status: DC | PRN
Start: 2022-02-25 — End: 2022-02-27
  Administered 2022-02-26: 1 via TOPICAL

## 2022-02-25 MED ORDER — SOD CITRATE-CITRIC ACID 500-334 MG/5ML PO SOLN
30.0000 mL | ORAL | Status: AC
  Administered 2022-02-25: 30 mL via ORAL

## 2022-02-25 MED ORDER — FENTANYL CITRATE (PF) 100 MCG/2ML IJ SOLN
INTRAMUSCULAR | Status: DC | PRN
  Administered 2022-02-25: 15 ug via INTRATHECAL

## 2022-02-25 MED ORDER — ONDANSETRON HCL 4 MG/2ML IJ SOLN: 4.0000 mg | Freq: Three times a day (TID) | INTRAMUSCULAR | Status: DC | PRN

## 2022-02-25 MED ORDER — MORPHINE SULFATE (PF) 0.5 MG/ML IJ SOLN
INTRAMUSCULAR | Status: AC
  Filled 2022-02-25: qty 10

## 2022-02-25 SURGICAL SUPPLY — 32 items
BENZOIN TINCTURE PRP APPL 2/3 (GAUZE/BANDAGES/DRESSINGS) ×2 IMPLANT
CHLORAPREP W/TINT 26ML (MISCELLANEOUS) ×4 IMPLANT
CLAMP CORD UMBIL (MISCELLANEOUS) ×2 IMPLANT
CLOTH BEACON ORANGE TIMEOUT ST (SAFETY) ×2 IMPLANT
CLSR STERI-STRIP ANTIMIC 1/2X4 (GAUZE/BANDAGES/DRESSINGS) ×2 IMPLANT
DRSG OPSITE POSTOP 4X10 (GAUZE/BANDAGES/DRESSINGS) ×2 IMPLANT
ELECT REM PT RETURN 9FT ADLT (ELECTROSURGICAL) ×2
ELECTRODE REM PT RTRN 9FT ADLT (ELECTROSURGICAL) ×1 IMPLANT
EXTRACTOR VACUUM KIWI (MISCELLANEOUS) IMPLANT
GLOVE BIO SURGEON STRL SZ 6.5 (GLOVE) ×2 IMPLANT
GLOVE BIOGEL PI IND STRL 6.5 (GLOVE) ×1 IMPLANT
GLOVE BIOGEL PI IND STRL 7.0 (GLOVE) ×2 IMPLANT
GLOVE BIOGEL PI INDICATOR 6.5 (GLOVE) ×1
GLOVE BIOGEL PI INDICATOR 7.0 (GLOVE) ×2
GOWN STRL REUS W/TWL LRG LVL3 (GOWN DISPOSABLE) ×4 IMPLANT
KIT ABG SYR 3ML LUER SLIP (SYRINGE) ×2 IMPLANT
NDL HYPO 25X5/8 SAFETYGLIDE (NEEDLE) ×1 IMPLANT
NEEDLE HYPO 25X5/8 SAFETYGLIDE (NEEDLE) ×2 IMPLANT
NS IRRIG 1000ML POUR BTL (IV SOLUTION) ×2 IMPLANT
PACK C SECTION WH (CUSTOM PROCEDURE TRAY) ×2 IMPLANT
PAD OB MATERNITY 4.3X12.25 (PERSONAL CARE ITEMS) ×2 IMPLANT
STRIP CLOSURE SKIN 1/2X4 (GAUZE/BANDAGES/DRESSINGS) ×1 IMPLANT
SUT PLAIN 0 NONE (SUTURE) IMPLANT
SUT PLAIN 2 0 (SUTURE) ×1
SUT PLAIN ABS 2-0 CT1 27XMFL (SUTURE) ×1 IMPLANT
SUT VIC AB 0 CT1 36 (SUTURE) ×2 IMPLANT
SUT VIC AB 0 CTX 36 (SUTURE) ×2
SUT VIC AB 0 CTX36XBRD ANBCTRL (SUTURE) ×2 IMPLANT
SUT VIC AB 4-0 PS2 27 (SUTURE) ×2 IMPLANT
TOWEL OR 17X24 6PK STRL BLUE (TOWEL DISPOSABLE) ×2 IMPLANT
TRAY FOLEY W/BAG SLVR 14FR LF (SET/KITS/TRAYS/PACK) IMPLANT
WATER STERILE IRR 1000ML POUR (IV SOLUTION) ×2 IMPLANT

## 2022-02-25 NOTE — Op Note (Signed)
PROCEDURE DATE: 02/25/22 ?  ?PREOPERATIVE DIAGNOSIS: Repeat Section ?  ?POSTOPERATIVE DIAGNOSIS: The same ?  ?PROCEDURE:    Repeat Low Transverse Cesarean Section ?  ?SURGEON:  Dr. Belva Agee ?  ?INDICATIONS: This is a 31 yo G2P1 at 3 wga requiring cesarean section secondary to desires repeat.  ?Decision made to proceed with LTCS. The risks of cesarean section discussed with the patient included but were not limited to: bleeding which may require transfusion or reoperation; infection which may require antibiotics; injury to bowel, bladder, ureters or other surrounding organs; injury to the fetus; need for additional procedures including hysterectomy in the event of a life-threatening hemorrhage; placental abnormalities wth subsequent pregnancies, incisional problems, thromboembolic phenomenon and other postoperative/anesthesia complications. The patient agreed with the proposed plan, giving informed consent for the procedure.   ?  ?FINDINGS:  Viable female infant in vertex presentation, APGARspending,  Weight pending, Amniotic fluid clear,  Intact placenta, three vessel cord.  Grossly normal uterus - minimal scarring ?Marland Kitchen   ?ANESTHESIA:    Epidural ?ESTIMATED BLOOD LOSS: 157cc ?SPECIMENS: Placenta for routine ?COMPLICATIONS: None immediate ?  ?PROCEDURE IN DETAIL:  The patient received intravenous antibiotics (2g Ancef) and had sequential compression devices applied to her lower extremities while in the preoperative area.  She was then taken to the operating room where epidural anesthesia was dosed up to surgical level and was found to be adequate. She was then placed in a dorsal supine position with a leftward tilt, and prepped and draped in a sterile manner.  A foley catheter was placed into her bladder and attached to constant gravity.  After an adequate timeout was performed, a Pfannenstiel skin incision was made with scalpel and carried through to the underlying layer of fascia. The fascia was incised in the  midline and this incision was extended bilaterally using the Mayo scissors. Kocher clamps were applied to the superior aspect of the fascial incision and the underlying rectus muscles were dissected off bluntly. A similar process was carried out on the inferior aspect of the facial incision. The rectus muscles were separated in the midline bluntly and the peritoneum was entered bluntly.  A bladder flap was created sharply and developed bluntly. A transverse hysterotomy was made with a scalpel and extended bilaterally bluntly. The bladder blade was then removed. The infant was successfully delivered, and cord was clamped and cut and infant was handed over to awaiting neonatology team. Uterine massage was then administered and the placenta delivered intact with three-vessel cord. Cord gases were taken. The uterus was cleared of clot and debris.  The hysterotomy was closed with 0 vicryl.  A second imbricating suture of 0-vicryl was used to reinforce the incision and aid in hemostasis.The fascia was closed with 0-Vicryl in a running fashion with good restoration of anatomy.  The subcutaneus tissue was irrigated and was reapproximated using three interrupted plain gut stitches.  The skin was closed with 4-0 Vicryl in a subcuticular fashion. ? ?It's a boy - "Aiden"!!   ?All surgical site and was hemostatic at end of procedure) without any further bleeding on exam.  ?  ?Pt tolerated the procedure well. All sponge/lap/needle counts were correct  X 2. Pt taken to recovery room in stable condition. ?  ?  ?Belva Agee MD ? ? ?

## 2022-02-25 NOTE — Progress Notes (Signed)
No updates to above H&P. Patient arrived NPO and was consented in PACU. Risks again discussed, all questions answered, and consent signed. Proceed with above surgery.    Christain Mcraney MD  

## 2022-02-25 NOTE — Anesthesia Postprocedure Evaluation (Signed)
Anesthesia Post Note ? ?Patient: Ashley Allison ? ?Procedure(s) Performed: REPEAT CESAREAN SECTION EDC: 03-03-22 ALLERG: NKDA  PREVIOUS X 1 ? ?  ? ?Patient location during evaluation: PACU ?Anesthesia Type: Spinal ?Level of consciousness: awake and alert ?Pain management: pain level controlled ?Vital Signs Assessment: post-procedure vital signs reviewed and stable ?Respiratory status: spontaneous breathing, nonlabored ventilation and respiratory function stable ?Cardiovascular status: blood pressure returned to baseline ?Postop Assessment: no apparent nausea or vomiting, spinal receding, no headache and no backache ?Anesthetic complications: no ? ? ?No notable events documented. ? ?Last Vitals:  ?Vitals:  ? 02/25/22 0945 02/25/22 1046  ?BP: 112/71 116/79  ?Pulse: 64 62  ?Resp: 16   ?Temp: 36.7 ?C   ?SpO2: 100%   ?  ?Last Pain:  ?Vitals:  ? 02/25/22 1046  ?TempSrc:   ?PainSc: 0-No pain  ? ?Pain Goal:   ? ?  ?  ?  ?  ?  ?  ?  ? ?Shanda Howells ? ? ? ? ?

## 2022-02-25 NOTE — Transfer of Care (Signed)
Immediate Anesthesia Transfer of Care Note ? ?Patient: Ashley Allison ? ?Procedure(s) Performed: REPEAT CESAREAN SECTION EDC: 03-03-22 ALLERG: NKDA  PREVIOUS X 1 ? ?Patient Location: PACU ? ?Anesthesia Type:Spinal ? ?Level of Consciousness: awake, alert  and oriented ? ?Airway & Oxygen Therapy: Patient Spontanous Breathing ? ?Post-op Assessment: Report given to RN and Post -op Vital signs reviewed and stable ? ?Post vital signs: Reviewed and stable ? ?Last Vitals:  ?Vitals Value Taken Time  ?BP    ?Temp    ?Pulse 95 02/25/22 0839  ?Resp 22 02/25/22 0839  ?SpO2 100 % 02/25/22 0839  ?Vitals shown include unvalidated device data. ? ?Last Pain:  ?Vitals:  ? 02/25/22 0542  ?TempSrc: Oral  ?PainSc: 0-No pain  ?   ? ?  ? ?Complications: No notable events documented. ?

## 2022-02-25 NOTE — Anesthesia Procedure Notes (Signed)
Spinal ? ?Patient location during procedure: OR ?Start time: 02/25/2022 7:29 AM ?End time: 02/25/2022 7:32 AM ?Reason for block: surgical anesthesia ?Staffing ?Performed: anesthesiologist and resident/CRNA  ?Anesthesiologist: Kaylyn Layer, MD ?Preanesthetic Checklist ?Completed: patient identified, IV checked, risks and benefits discussed, monitors and equipment checked, pre-op evaluation and timeout performed ?Spinal Block ?Patient position: sitting ?Prep: DuraPrep and site prepped and draped ?Patient monitoring: heart rate, continuous pulse ox and blood pressure ?Approach: midline ?Location: L3-4 ?Injection technique: single-shot ?Needle ?Needle type: Pencan  ?Needle gauge: 24 G ?Needle length: 10 cm ?Assessment ?Sensory level: T4 ?Events: CSF return ?Additional Notes ?Risks, benefits, and alternative discussed. Patient gave consent to procedure. Prepped and draped in sitting position. Clear CSF obtained after one needle pass. Positive terminal aspiration. No pain or paraesthesias with injection. Patient tolerated procedure well. Vital signs stable. Procedure performed by Bradly Bienenstock, SRNA under my direct supervision. Amalia Greenhouse, MD ? ? ? ? ?

## 2022-02-25 NOTE — Anesthesia Preprocedure Evaluation (Addendum)
Anesthesia Evaluation  Patient identified by MRN, date of birth, ID band Patient awake    Reviewed: Allergy & Precautions, NPO status , Patient's Chart, lab work & pertinent test results  History of Anesthesia Complications Negative for: history of anesthetic complications  Airway Mallampati: II  TM Distance: >3 FB Neck ROM: Full    Dental no notable dental hx.    Pulmonary neg pulmonary ROS,    Pulmonary exam normal        Cardiovascular negative cardio ROS Normal cardiovascular exam     Neuro/Psych negative neurological ROS  negative psych ROS   GI/Hepatic negative GI ROS, Neg liver ROS,   Endo/Other  negative endocrine ROS  Renal/GU negative Renal ROS  negative genitourinary   Musculoskeletal negative musculoskeletal ROS (+)   Abdominal   Peds  Hematology negative hematology ROS (+)   Anesthesia Other Findings Day of surgery medications reviewed with patient.  Reproductive/Obstetrics (+) Pregnancy (Hx of C/S x1)                            Anesthesia Physical Anesthesia Plan  ASA: 2  Anesthesia Plan: Spinal   Post-op Pain Management:    Induction:   PONV Risk Score and Plan: 4 or greater and Treatment may vary due to age or medical condition, Ondansetron and Dexamethasone  Airway Management Planned: Natural Airway  Additional Equipment: None  Intra-op Plan:   Post-operative Plan:   Informed Consent: I have reviewed the patients History and Physical, chart, labs and discussed the procedure including the risks, benefits and alternatives for the proposed anesthesia with the patient or authorized representative who has indicated his/her understanding and acceptance.       Plan Discussed with: CRNA  Anesthesia Plan Comments:        Anesthesia Quick Evaluation  

## 2022-02-25 NOTE — Lactation Note (Signed)
This note was copied from a baby's chart. ?Lactation Consultation Note ? ?Patient Name: Ashley Allison ?Today's Date: 02/25/2022 ?Reason for consult: Initial assessment;Mother's request;Term;Breastfeeding assistance ?Age:33 hours ? ?Mom stated infant feeding well, with no complaints of nipple pain. Infant voided 4 x so far.  ? ?Plan 1. To feed based on cues 8-12x 24hr period. Mom to offer breasts and look for signs of milk transfer. ?2. IF unable to latch, Mom to offer eBM on spoon 5-7 ml per feeding.  ?3. I and O sheet reviewed.  ?All questions answered  ?Maternal Data ?Has patient been taught Hand Expression?: Yes ?Does the patient have breastfeeding experience prior to this delivery?: Yes ?How long did the patient breastfeed?: 2 months ? ?Feeding ?Mother's Current Feeding Choice: Breast Milk ? ?LATCH Score ?  ? ?  ? ?  ? ?  ? ?  ? ?  ? ? ?Lactation Tools Discussed/Used ?  ? ?Interventions ?Interventions: Breast feeding basics reviewed;Hand express;Expressed milk;Education;LC Services brochure;Infant Driven Feeding Algorithm education ? ?Discharge ?  ? ?Consult Status ?Consult Status: Follow-up ?Date: 02/26/22 ?Follow-up type: In-patient ? ? ? ?Ashley Galyon  Allison ?02/25/2022, 6:55 PM ? ? ? ?

## 2022-02-26 LAB — CBC
HCT: 30.1 % — ABNORMAL LOW (ref 36.0–46.0)
Hemoglobin: 10.6 g/dL — ABNORMAL LOW (ref 12.0–15.0)
MCH: 32.1 pg (ref 26.0–34.0)
MCHC: 35.2 g/dL (ref 30.0–36.0)
MCV: 91.2 fL (ref 80.0–100.0)
Platelets: 144 10*3/uL — ABNORMAL LOW (ref 150–400)
RBC: 3.3 MIL/uL — ABNORMAL LOW (ref 3.87–5.11)
RDW: 13.5 % (ref 11.5–15.5)
WBC: 10.3 10*3/uL (ref 4.0–10.5)
nRBC: 0 % (ref 0.0–0.2)

## 2022-02-26 LAB — BIRTH TISSUE RECOVERY COLLECTION (PLACENTA DONATION)

## 2022-02-26 MED ORDER — DIBUCAINE (PERIANAL) 1 % EX OINT: 1.0000 "application " | TOPICAL_OINTMENT | CUTANEOUS | Status: DC | PRN

## 2022-02-26 MED ORDER — WITCH HAZEL-GLYCERIN EX PADS: 1.0000 "application " | MEDICATED_PAD | CUTANEOUS | Status: DC | PRN

## 2022-02-26 NOTE — Progress Notes (Signed)
Subjective: ?Postpartum Day 1: Cesarean Delivery ?Patient reports tolerating PO, + flatus, and no problems voiding.   ? ?Objective: ?Vital signs in last 24 hours: ?Temp:  [98 ?F (36.7 ?C)-98.5 ?F (36.9 ?C)] 98.5 ?F (36.9 ?C) (05/04 2238) ?Pulse Rate:  [62-75] 74 (05/04 2238) ?Resp:  [16] 16 (05/04 2238) ?BP: (108-116)/(61-79) 110/67 (05/04 2238) ?SpO2:  [97 %-100 %] 97 % (05/04 2238) ? ?Physical Exam:  ?General: alert, cooperative, appears stated age, and no distress ?Lochia: appropriate ?Uterine Fundus: firm ?Incision: healing well ?DVT Evaluation: No evidence of DVT seen on physical exam. ? ?Recent Labs  ?  02/23/22 ?1108 02/26/22 ?0501  ?HGB 12.0 10.6*  ?HCT 34.3* 30.1*  ? ? ?Assessment/Plan: ?Status post Cesarean section. Doing well postoperatively.  ?Continue current care. ? ?Ashley Allison ?02/26/2022, 9:41 AM ? ? ?

## 2022-02-26 NOTE — Lactation Note (Addendum)
This note was copied from a baby's chart. ?Lactation Consultation Note ? ?Patient Name: Ashley Allison ?Today's Date: 02/26/2022 ?Reason for consult: Follow-up assessment;Term ?Age:33 hours ? ? ?P2 mother whose infant is now 45 hours old.  This is a term baby at 39+1 weeks.  Mother's current feeding preference is breast. ? ?Mother had visitors present.  She reports that breast feeding is going well; no questions/concerns at this time.  She continues to work with latching deeply.  Baby is voiding/stooling. ? ?Provided [employer] employee pump (Sonata) per mother's request.  All paperwork completed and placed in basket in lactation office.  Returned insurance card and receipt to parent's.  They are aware to keep this receipt if needed for a pump return.  Father present and supportive.  Mother also requested a manual pump; provided pump.  Mother did not wish to review. ? ? ?Maternal Data ?  ? ?Feeding ?Mother's Current Feeding Choice: Breast Milk ? ?LATCH Score ?  ? ?  ? ?  ? ?  ? ?  ? ?  ? ? ?Lactation Tools Discussed/Used ?  ? ?Interventions ?  ? ?Discharge ?Pump: Employee Pump [employer]) ? ?Consult Status ?Consult Status: Follow-up ?Date: 02/27/22 ?Follow-up type: In-patient ? ? ? ?Wandy Bossler R Tomisha Reppucci ?02/26/2022, 6:51 PM ? ? ? ?

## 2022-02-27 MED ORDER — IBUPROFEN 600 MG PO TABS
600.0000 mg | ORAL_TABLET | Freq: Four times a day (QID) | ORAL | 0 refills | Status: DC | PRN
  Filled 2022-02-27: qty 30, 8d supply, fill #0

## 2022-02-27 NOTE — Discharge Summary (Signed)
? ?  Postpartum Discharge Summary ? ? ? ?   ?Patient Name: Ashley Allison ?DOB: [birth year] ?MRN: [id number] ? ?Date of admission: 02/25/2022 ?Delivery date:02/25/2022  ?Delivering provider: Lucillie Garfinkel JENNIFER  ?Date of discharge: 02/27/2022 ? ?Admitting diagnosis: Pregnancy [Z34.90] ?Intrauterine pregnancy: [redacted]w[redacted]d    ?Secondary diagnosis:  Principal Problem: ?  Pregnancy ? ?Additional problems:      ?Discharge diagnosis: Term Pregnancy Delivered                                              ?Post partum procedures:   ?Augmentation: N/A ?Complications: None ? ?Hospital course: Sceduled C/S   31y.o. yo G2P2002 at 352w1das admitted to the hospital 02/25/2022 for scheduled cesarean section with the following indication:Elective Repeat.Delivery details are as follows:  ?Membrane Rupture Time/Date: 7:53 AM ,02/25/2022   ?Delivery Method:C-Section, Low Transverse  ?Details of operation can be found in separate operative note.  Patient had an uncomplicated postpartum course.  She is ambulating, tolerating a regular diet, passing flatus, and urinating well. Patient is discharged home in stable condition on  02/27/22 ?       ?Newborn Data: ?Birth date:02/25/2022  ?Birth time:7:54 AM  ?Gender:Female  ?Living status:Living  ?Apgars:8 ,9  ?Weight:3520 g    ? ?Magnesium Sulfate received: No ?BMZ received: No ?Rhophylac:N/A ?MMR:N/A ?T-DaP:Given prenatally ?Flu: N/A ?Transfusion:No ? ?Physical exam  ?Vitals:  ? 02/25/22 2238 02/26/22 1442 02/26/22 2029 02/27/22 0546  ?BP: 110/67 113/63 118/72 120/71  ?Pulse: 74 79 89 92  ?Resp: '16 17 18 18  ' ?Temp: 98.5 ?F (36.9 ?C) 98 ?F (36.7 ?C) 98.4 ?F (36.9 ?C) 98.2 ?F (36.8 ?C)  ?TempSrc: Oral Oral Oral Oral  ?SpO2: 97% 98% 98% 98%  ?Weight:      ?Height:      ? ?General: alert, cooperative, and no distress ?Lochia: appropriate ?Uterine Fundus: firm ?Incision: Healing well with no significant drainage ?DVT Evaluation: No evidence of DVT seen on physical exam. ?Labs: ?Lab Results  ?Component Value Date  ?  WBC 10.3 02/26/2022  ? HGB 10.6 (L) 02/26/2022  ? HCT 30.1 (L) 02/26/2022  ? MCV 91.2 02/26/2022  ? PLT 144 (L) 02/26/2022  ? ? ?  Latest Ref Rng & Units 03/13/2018  ? 10:14 AM  ?CMP  ?Glucose 65 - 99 mg/dL 46    ?BUN 6 - 20 mg/dL 7    ?Creatinine 0.57 - 1.00 mg/dL 0.45    ?Sodium 134 - 144 mmol/L 140    ?Potassium 3.5 - 5.2 mmol/L 3.7    ?Chloride 96 - 106 mmol/L 102    ?CO2 20 - 29 mmol/L 19    ?Calcium 8.7 - 10.2 mg/dL 9.3    ?Total Protein 6.0 - 8.5 g/dL 6.1    ?Total Bilirubin 0.0 - 1.2 mg/dL 0.7    ?Alkaline Phos 39 - 117 IU/L 114    ?AST 0 - 40 IU/L 17    ?ALT 0 - 32 IU/L 14    ? ?Edinburgh Score: ? ?  02/25/2022  ? 12:03 PM  ?EdFlavia Shipperostnatal Depression Scale Screening Tool  ?I have been able to laugh and see the funny side of things. 0  ?I have looked forward with enjoyment to things. 0  ?I have blamed myself unnecessarily when things went wrong. 0  ?I have been anxious or worried for no good reason. 0  ?  I have felt scared or panicky for no good reason. 0  ?Things have been getting on top of me. 0  ?I have been so unhappy that I have had difficulty sleeping. 0  ?I have felt sad or miserable. 0  ?I have been so unhappy that I have been crying. 0  ?The thought of harming myself has occurred to me. 0  ?Edinburgh Postnatal Depression Scale Total 0  ? ? ? ? ?After visit meds:  ?Allergies as of 02/27/2022   ?No Known Allergies ?  ? ?  ?Medication List  ?  ? ?TAKE these medications   ? ?acetaminophen 500 MG tablet ?Commonly known as: TYLENOL ?Take 2 tablets (1,000 mg total) by mouth every 8 (eight) hours as needed. ?What changed:  ?how much to take ?reasons to take this ?  ?ibuprofen 600 MG tablet ?Commonly known as: ADVIL ?Take 1 tablet (600 mg total) by mouth every 6 (six) hours as needed for mild pain or moderate pain. ?  ?omeprazole 20 MG tablet ?Commonly known as: PRILOSEC OTC ?Take 20 mg by mouth daily as needed (acid reflux). ?  ?PRENATAL VITAMIN PO ?Take 1 tablet by mouth daily. ?  ? ?  ? ? ? ?Discharge home  in stable condition ?Infant Feeding: Breast ?Infant Disposition:home with mother ?Discharge instruction: per After Visit Summary and Postpartum booklet. ?Activity: Advance as tolerated. Pelvic rest for 6 weeks.  ?Diet: routine diet ?Anticipated Birth Control: Unsure ?Postpartum Appointment:6 weeks ?Additional Postpartum F/U:    ?Future Appointments:No future appointments. ?Follow up Visit: ? ? ?  ? ?02/27/2022 ?Luz Lex, MD ? ? ?

## 2022-02-27 NOTE — Plan of Care (Signed)
Discharge instructions given, pt receptive.  

## 2022-03-01 ENCOUNTER — Other Ambulatory Visit (HOSPITAL_COMMUNITY): Payer: Self-pay

## 2022-03-05 ENCOUNTER — Telehealth (HOSPITAL_COMMUNITY): Payer: Self-pay

## 2022-03-05 NOTE — Telephone Encounter (Addendum)
"  We have been good. I'm having minimal to no pain. My dressing is off, no drainage. My incision is good." Patient declines questions or concerns about her healing. ? ?"He's doing well. He is back up to birthweight. He sleeps in a bassinet next to our bed." RN reviewed ABC's of safe sleep with patient. Patient declines any questions or concerns about baby. ? ?EPDS score is 0. ? ?Marcelino Duster Aldina Porta,RN3,MSN,RNC-MNN ?05/12//2023,1800 ?

## 2022-04-20 NOTE — Progress Notes (Signed)
Complete physical exam   Patient: Ashley Allison   DOB: December 25, 1990   31 y.o. Female  MRN: [id number] Visit Date: 04/21/2022  Chief Complaint  Patient presents with   Annual Exam    Fasting CPE- [facility]   Subjective    Ashley Allison is a 31 y.o. female who presents today for a complete physical exam.   Reports is generally feeling well; had a son on 02/25/2022 and is feeling well; is eating a regular diet; is sleeping about 7 - 8 hours sometimes interrupted with a newborn baby; drinks 64 ounces bottles of water a day; is exercising with elliptical walking 3 days a week.   HPI HPI     Annual Exam    Additional comments: Fasting CPE- Paps [facility] at [facility].      Last edited by Sammuel Cooper, CMA on 04/21/2022  3:20 PM.        Past Medical History:  Diagnosis Date   Female infertility associated with anovulation 05/04/2019   History of chicken pox    Normal labor and delivery 05/30/2018   Pregnancy 02/25/2022   Past Surgical History:  Procedure Laterality Date   CESAREAN SECTION N/A 05/31/2018   Procedure: CESAREAN SECTION;  Surgeon: Zelphia Cairo, MD;  Location: [facility];  Service: Obstetrics;  Laterality: N/A;   CESAREAN SECTION N/A 02/25/2022   Procedure: REPEAT CESAREAN SECTION EDC: 03-03-22 ALLERG: NKDA  PREVIOUS X 1;  Surgeon: Ranae Pila, MD;  Location: [facility] LD ORS;  Service: Obstetrics;  Laterality: N/A;   WISDOM TOOTH EXTRACTION  2012   Social History   Socioeconomic History   Marital status: Married    Spouse name: Not on file   Number of children: Not on file   Years of education: Not on file   Highest education level: Not on file  Occupational History   Not on file  Tobacco Use   Smoking status: Never    Passive exposure: Never   Smokeless tobacco: Never  Vaping Use   Vaping Use: Never used  Substance and Sexual Activity   Alcohol use: Not Currently    Comment: 2-3 glass a week- (no drinking while  preg)   Drug use: Never   Sexual activity: Yes    Birth control/protection: None  Other Topics Concern   Not on file  Social History Narrative   Not on file   Social Determinants of Health   Financial Resource Strain: Not on file  Food Insecurity: Not on file  Transportation Needs: Not on file  Physical Activity: Not on file  Stress: Not on file  Social Connections: Not on file  Intimate Partner Violence: Not on file   Family Status  Relation Name Status   Mother  Alive   Father  Alive   MGM  Deceased   MGF  Deceased   PGM  Alive   PGF  Deceased   Oceanographer  (Not Specified)   Family History  Problem Relation Age of Onset   Hypertension Father    Bladder Cancer Maternal Grandmother         died age 14   Liver cancer Paternal Grandfather 76   Breast cancer Paternal Aunt    No Known Allergies  Patient Care Team: Lexine Baton as PCP - General (Physician Assistant) Ginette Otto, [facility] ([facility])   Medications: Outpatient Medications Prior to Visit  Medication Sig   Prenatal Vit-Fe Fumarate-FA (PRENATAL  VITAMIN PO) Take 1 tablet by mouth daily.   [DISCONTINUED] acetaminophen (TYLENOL) 500 MG tablet Take 2 tablets (1,000 mg total) by mouth every 8 (eight) hours as needed. (Patient taking differently: Take 500-1,000 mg by mouth every 8 (eight) hours as needed for moderate pain.)   [DISCONTINUED] ibuprofen (ADVIL) 600 MG tablet Take 1 tablet (600 mg total) by mouth every 6 (six) hours as needed for mild pain or moderate pain.   [DISCONTINUED] omeprazole (PRILOSEC OTC) 20 MG tablet Take 20 mg by mouth daily as needed (acid reflux).   No facility-administered medications prior to visit.    Review of Systems  Constitutional:  Negative for activity change and fever.  HENT:  Negative for congestion, ear pain and voice change.   Eyes:  Negative for redness.  Respiratory:  Negative for cough.   Cardiovascular:  Negative for chest  pain.  Gastrointestinal:  Negative for constipation and diarrhea.  Endocrine: Negative for polyuria.  Genitourinary:  Negative for flank pain.  Musculoskeletal:  Negative for gait problem and neck stiffness.  Skin:  Negative for color change and rash.  Neurological:  Negative for dizziness.  Hematological:  Negative for adenopathy.  Psychiatric/Behavioral:  Negative for agitation, behavioral problems and confusion.     Last CBC Lab Results  Component Value Date   WBC 10.3 02/26/2022   HGB 10.6 (L) 02/26/2022   HCT 30.1 (L) 02/26/2022   MCV 91.2 02/26/2022   MCH 32.1 02/26/2022   RDW 13.5 02/26/2022   PLT 144 (L) 02/26/2022   Last metabolic panel Lab Results  Component Value Date   GLUCOSE 46 (L) 03/13/2018   NA 140 03/13/2018   K 3.7 03/13/2018   CL 102 03/13/2018   CO2 19 (L) 03/13/2018   BUN 7 03/13/2018   CREATININE 0.45 (L) 03/13/2018   GFRNONAA 137 03/13/2018   CALCIUM 9.3 03/13/2018   PROT 6.1 03/13/2018   ALBUMIN 3.7 03/13/2018   LABGLOB 2.4 03/13/2018   AGRATIO 1.5 03/13/2018   BILITOT 0.7 03/13/2018   ALKPHOS 114 03/13/2018   AST 17 03/13/2018   ALT 14 03/13/2018   Last lipids Lab Results  Component Value Date   CHOL 272 (H) 03/13/2018   HDL 73 03/13/2018   LDLCALC 163 (H) 03/13/2018   TRIG 182 (H) 03/13/2018   CHOLHDL 3.7 03/13/2018   Last thyroid functions No results found for: "TSH", "T3TOTAL", "T4TOTAL", "THYROIDAB"    The ASCVD Risk score (Arnett DK, et al., 2019) failed to calculate for the following reasons:   The 2019 ASCVD risk score is only valid for ages 29 to 77   Objective    BP 110/70   Pulse 76   Ht 5\' 5"  (1.651 m)   Wt 150 lb (68 kg)   LMP  (LMP Unknown)   SpO2 99%   Breastfeeding Yes   BMI 24.96 kg/m      Physical Exam Vitals and nursing note reviewed.  Constitutional:      General: She is not in acute distress.    Appearance: Normal appearance. She is not ill-appearing.  HENT:     Head: Normocephalic and  atraumatic.     Right Ear: Tympanic membrane, ear canal and external ear normal.     Left Ear: Tympanic membrane, ear canal and external ear normal.     Nose: No congestion.  Eyes:     Extraocular Movements: Extraocular movements intact.     Conjunctiva/sclera: Conjunctivae normal.     Pupils: Pupils are equal, round, and  reactive to light.  Neck:     Vascular: No carotid bruit.  Cardiovascular:     Rate and Rhythm: Normal rate and regular rhythm.     Pulses: Normal pulses.     Heart sounds: Normal heart sounds.  Pulmonary:     Effort: Pulmonary effort is normal.     Breath sounds: Normal breath sounds. No wheezing.  Abdominal:     General: Bowel sounds are normal.     Palpations: Abdomen is soft.  Musculoskeletal:        General: Normal range of motion.     Cervical back: Normal range of motion and neck supple.     Right lower leg: No edema.     Left lower leg: No edema.  Skin:    General: Skin is warm and dry.     Findings: No bruising.  Neurological:     General: No focal deficit present.     Mental Status: She is alert and oriented to person, place, and time.  Psychiatric:        Mood and Affect: Mood normal.        Behavior: Behavior normal.        Thought Content: Thought content normal.       Last depression screening scores    04/21/2022    3:21 PM 03/13/2018    9:29 AM  PHQ 2/9 Scores  PHQ - 2 Score 0 0   Last fall risk screening    04/21/2022    3:21 PM  Fall Risk   Falls in the past year? 0  Number falls in past yr: 0  Injury with Fall? 0  Risk for fall due to : No Fall Risks  Follow up Falls evaluation completed     No results found for any visits on 04/21/22.  Assessment & Plan    Routine Health Maintenance and Physical Exam  Exercise Activities and Dietary recommendations  Goals   None     Immunization History  Administered Date(s) Administered   Influenza,inj,quad, With Preservative 07/23/2021   Influenza-Unspecified 07/30/2015,  08/04/2020   Tdap 12/09/2021    Health Maintenance  Topic Date Due   PAP SMEAR-Modifier  05/03/2022 (Originally 10/28/2020)   INFLUENZA VACCINE  05/25/2022   TETANUS/TDAP  12/10/2031   Hepatitis C Screening  Completed   HIV Screening  Completed   HPV VACCINES  Aged Out    Discussed health benefits of physical activity, and encouraged her to engage in regular exercise appropriate for her age and condition.  Problem List Items Addressed This Visit       Other   Mixed hyperlipidemia    controlled, eat a low fat diet, increase fiber intake (Benefiber or Metamucil, Cherrios,  oatmeal, beans, nuts, fruits and vegetables), limit saturated fats (in fried foods, red meat), can add OTC fish oil supplement, eat fish with Omega-3 fatty acids like salmon and tuna, exercise for 30 minutes 3 - 5 times a week, drink 8 - 10 glasses of water a day; lipid panel checked today        Relevant Orders   Comprehensive metabolic panel   Lipid panel   Absolute anemia    Post-partum, delivered 02/25/2022, will check CBC today      Relevant Orders   CBC with Differential/Platelet   TSH + free T4   Other Visit Diagnoses     Routine medical exam    -  Primary   Relevant Orders   CBC with Differential/Platelet  Comprehensive metabolic panel   Lipid panel   TSH + free T4        Return in about 1 year (around 04/22/2023) for Return for Annual Exam.     Jake Shark, PA-C

## 2022-04-21 ENCOUNTER — Ambulatory Visit
Payer: No Typology Code available for payment source | Attending: Obstetrics and Gynecology | Admitting: Physical Therapy

## 2022-04-21 ENCOUNTER — Ambulatory Visit (INDEPENDENT_AMBULATORY_CARE_PROVIDER_SITE_OTHER): Payer: No Typology Code available for payment source | Admitting: Physician Assistant

## 2022-04-21 ENCOUNTER — Encounter: Payer: Self-pay | Admitting: Physical Therapy

## 2022-04-21 ENCOUNTER — Encounter: Payer: Self-pay | Admitting: Physician Assistant

## 2022-04-21 VITALS — BP 110/70 | HR 76 | Ht 65.0 in | Wt 150.0 lb

## 2022-04-21 DIAGNOSIS — M62838 Other muscle spasm: Secondary | ICD-10-CM | POA: Diagnosis present

## 2022-04-21 DIAGNOSIS — M6281 Muscle weakness (generalized): Secondary | ICD-10-CM | POA: Insufficient documentation

## 2022-04-21 DIAGNOSIS — E782 Mixed hyperlipidemia: Secondary | ICD-10-CM

## 2022-04-21 DIAGNOSIS — D508 Other iron deficiency anemias: Secondary | ICD-10-CM | POA: Diagnosis not present

## 2022-04-21 DIAGNOSIS — Z0000 Encounter for general adult medical examination without abnormal findings: Secondary | ICD-10-CM | POA: Diagnosis not present

## 2022-04-21 DIAGNOSIS — D649 Anemia, unspecified: Secondary | ICD-10-CM | POA: Insufficient documentation

## 2022-04-21 NOTE — Patient Instructions (Signed)
.Preventative Care for Adults - Female      MAINTAIN REGULAR HEALTH EXAMS: A routine yearly physical is a good way to check in with your primary care provider about your health and preventive screening. It is also an opportunity to share updates about your health and any concerns you have, and receive a thorough all-over exam.  Most health insurance companies pay for at least some preventative services.  Check with your health plan for specific coverages.  WHAT PREVENTATIVE SERVICES DO WOMEN NEED? Adult women should have their weight and blood pressure checked regularly.  Women age 35 and older should have their cholesterol levels checked regularly. Women should be screened for cervical cancer with a Pap smear and pelvic exam beginning at either age 21, or 3 years after they become sexually activity.   Breast cancer screening generally begins at age 40 with a mammogram and breast exam by your primary care provider.   Beginning at age 45 and continuing to age 75, women should be screened for colorectal cancer.  Certain people may need continued testing until age 85. Updating vaccinations is part of preventative care.  Vaccinations help protect against diseases such as the flu. Osteoporosis is a disease in which the bones lose minerals and strength as we age. Women ages 65 and over should discuss this with their caregivers, as should women after menopause who have other risk factors. Lab tests are generally done as part of preventative care to screen for anemia and blood disorders, to screen for problems with the kidneys and liver, to screen for bladder problems, to check blood sugar, and to check your cholesterol level. Preventative services generally include counseling about diet, exercise, avoiding tobacco, drugs, excessive alcohol consumption, and sexually transmitted infections.    GENERAL RECOMMENDATIONS FOR GOOD HEALTH:  Healthy diet: Eat a variety of foods, including fruit, vegetables,  animal or vegetable protein, such as meat, fish, chicken, and eggs, or beans, lentils, tofu, and grains, such as rice. Drink plenty of water daily (60 - 80 ounces or 8 - 10 glasses a day) Decrease saturated fat in the diet, avoid lots of red meat, processed foods, sweets, fast foods, and fried foods. For high cholesterol - Increase fiber intake (Benefiber or Metamucil, Cherrios,  oatmeal, beans, nuts, fruits and vegetables), limit saturated fats (in fried foods, red meat), can add OTC fish oil supplement, eat fish with Omega-3 fatty acids like salmon and tuna, exercise for 30 minutes 3 - 5 times a week, drink 8 - 10 glasses of water a day  Exercise: Aerobic exercise helps maintain good heart health. At least 30-40 minutes of moderate-intensity exercise is recommended. For example, a brisk walk that increases your heart rate and breathing. This should be done on most days of the week.  Find a type of exercise or a variety of exercises that you enjoy so that it becomes a part of your daily life.  Examples are running, walking, swimming, water aerobics, and biking.  For motivation and support, explore group exercise such as aerobic class, spin class, Zumba, Yoga,or  martial arts, etc.   Set exercise goals for yourself, such as a certain weight goal, walk or run in a race such as a 5k walk/run.  Speak to your primary care provider about exercise goals.  Disease prevention: If you smoke or chew tobacco, find out from your caregiver how to quit. It can literally save your life, no matter how long you have been a tobacco user. If you do not   use tobacco, never begin.  Maintain a healthy diet and normal weight. Increased weight leads to problems with blood pressure and diabetes.  The Body Mass Index or BMI is a way of measuring how much of your body is fat. Having a BMI above 27 increases the risk of heart disease, diabetes, hypertension, stroke and other problems related to obesity. Your caregiver can help  determine your BMI and based on it develop an exercise and dietary program to help you achieve or maintain this important measurement at a healthful level. High blood pressure causes heart and blood vessel problems.  Persistent high blood pressure should be treated with medicine if weight loss and exercise do not work.  Fat and cholesterol leaves deposits in your arteries that can block them. This causes heart disease and vessel disease elsewhere in your body.  If your cholesterol is found to be high, or if you have heart disease or certain other medical conditions, then you may need to have your cholesterol monitored frequently and be treated with medication.  Ask if you should have a cardiac stress test if your history suggests this. A stress test is a test done on a treadmill that looks for heart disease. This test can find disease prior to there being a problem. Menopause can be associated with physical symptoms and risks. Hormone replacement therapy is available to decrease these. You should talk to your caregiver about whether starting or continuing to take hormones is right for you.  Osteoporosis is a disease in which the bones lose minerals and strength as we age. This can result in serious bone fractures. Risk of osteoporosis can be identified using a bone density scan. Women ages 65 and over should discuss this with their caregivers, as should women after menopause who have other risk factors. Ask your caregiver whether you should be taking a calcium supplement and Vitamin D, to reduce the rate of osteoporosis.  Avoid drinking alcohol in excess (more than two drinks per day).  Avoid use of street drugs. Do not share needles with anyone. Ask for professional help if you need assistance or instructions on stopping the use of alcohol, cigarettes, and/or drugs. Brush your teeth twice a day with fluoride toothpaste, and floss once a day. Good oral hygiene prevents tooth decay and gum disease. The  problems can be painful, unattractive, and can cause other health problems. Visit your dentist for a routine oral and dental check up and preventive care every 6-12 months.  Look at your skin regularly.  Use a mirror to look at your back. Notify your caregivers of changes in moles, especially if there are changes in shapes, colors, a size larger than a pencil eraser, an irregular border, or development of new moles.  Safety: Use seatbelts 100% of the time, whether driving or as a passenger.  Use safety devices such as hearing protection if you work in environments with loud noise or significant background noise.  Use safety glasses when doing any work that could send debris in to the eyes.  Use a helmet if you ride a bike or motorcycle.  Use appropriate safety gear for contact sports.  Talk to your caregiver about gun safety. Use sunscreen with a SPF (or skin protection factor) of 15 or greater.  Lighter skinned people are at a greater risk of skin cancer. Don't forget to also wear sunglasses in order to protect your eyes from too much damaging sunlight. Damaging sunlight can accelerate cataract formation.  Practice safe sex. Use   condoms. Condoms are used for birth control and to help reduce the spread of sexually transmitted infections (or STIs).  Some of the STIs are gonorrhea (the clap), chlamydia, syphilis, trichomonas, herpes, HPV (human papilloma virus) and HIV (human immunodeficiency virus) which causes AIDS. The herpes, HIV and HPV are viral illnesses that have no cure. These can result in disability, cancer and death.  Keep carbon monoxide and smoke detectors in your home functioning at all times. Change the batteries every 6 months or use a model that plugs into the wall.   Vaccinations: Stay up to date with your tetanus shots and other required immunizations. You should have a booster for tetanus every 10 years. Be sure to get your flu shot every year, since 5%-20% of the [place] population comes  down with the flu. The flu vaccine changes each year, so being vaccinated once is not enough. Get your shot in the fall, before the flu season peaks.   Other vaccines to consider: Human Papilloma Virus or HPV causes cancer of the cervix, and other infections that can be transmitted from person to person. There is a vaccine for HPV, and females should get immunized between the ages of 11 and 26. It requires a series of 3 shots.  Pneumococcal vaccine to protect against certain types of pneumonia.  This is normally recommended for adults age 65 or older.  However, adults younger than 33 years old with certain underlying conditions such as diabetes, heart or lung disease should also receive the vaccine. Shingles vaccine to protect against Varicella Zoster if you are older than age 60, or younger than 33 years old with certain underlying illness. Hepatitis A vaccine to protect against a form of infection of the liver by a virus acquired from food. Hepatitis B vaccine to protect against a form of infection of the liver by a virus acquired from blood or body fluids, particularly if you work in health care. If you plan to travel internationally, check with your local health department for specific vaccination recommendations.  Cancer Screening: Breast cancer screening is essential to preventive care for women. All women age 20 and older should perform a breast self-exam every month. At age 40 and older, women should have their caregiver complete a breast exam each year. Women at ages 40 and older should have a mammogram (x-ray film) of the breasts. Your caregiver can discuss how often you need mammograms.   Cervical cancer screening includes taking a Pap smear (sample of cells examined under a microscope) from the cervix (end of the uterus). It also includes testing for HPV (Human Papilloma Virus, which can cause cervical cancer). Screening and a pelvic exam should begin at age 21, or 3 years after a woman  becomes sexually active. Screening should occur every year, with a Pap smear but no HPV testing, up to age 30. After age 30, you should have a Pap smear every 3 years with HPV testing, if no HPV was found previously.  Most routine colon cancer screening begins at the age of 50. On a yearly basis, doctors may provide special easy to use take-home tests to check for hidden blood in the stool. Sigmoidoscopy or colonoscopy can detect the earliest forms of colon cancer and is life saving. These tests use a small camera at the end of a tube to directly examine the colon. Speak to your caregiver about this at age 50, when routine screening begins (and is repeated every 5 years unless early forms of pre-cancerous polyps   or small growths are found).    

## 2022-04-21 NOTE — Assessment & Plan Note (Signed)
Post-partum, delivered 02/25/2022, will check CBC today

## 2022-04-21 NOTE — Therapy (Signed)
OUTPATIENT PHYSICAL THERAPY FEMALE PELVIC EVALUATION   Patient Name: Ashley Allison MRN: [id number] DOB:1991/05/29, 31 y.o., female Today's Date: 04/21/2022   PT End of Session - 04/21/22 1416     Visit Number 1    Date for PT Re-Evaluation 07/14/22    Authorization Type [employer] - [employer] employee    PT Start Time 1230    PT Stop Time 1313    PT Time Calculation (min) 43 min    Activity Tolerance Patient tolerated treatment well    Behavior During Therapy WFL for tasks assessed/performed             Past Medical History:  Diagnosis Date   History of chicken pox    Past Surgical History:  Procedure Laterality Date   CESAREAN SECTION N/A 05/31/2018   Procedure: CESAREAN SECTION;  Surgeon: Zelphia Cairo, MD;  Location: [facility] BIRTHING SUITES;  Service: Obstetrics;  Laterality: N/A;   CESAREAN SECTION N/A 02/25/2022   Procedure: REPEAT CESAREAN SECTION EDC: 03-03-22 ALLERG: NKDA  PREVIOUS X 1;  Surgeon: Ranae Pila, MD;  Location: [facility] LD ORS;  Service: Obstetrics;  Laterality: N/A;   WISDOM TOOTH EXTRACTION  2012   Patient Active Problem List   Diagnosis Date Noted   Pregnancy 02/25/2022   Female infertility associated with anovulation 05/04/2019   S/P cesarean section 05/31/2018   Normal labor and delivery 05/30/2018    PCP: Lexine Baton  REFERRING PROVIDER: Merideth Abbey, MD  REFERRING DIAG: post partum state  THERAPY DIAG:  Muscle weakness (generalized)  Other muscle spasm  Rationale for Evaluation and Treatment Rehabilitation  ONSET DATE: 02/25/22 - c-section  SUBJECTIVE:                                                                                                                                                                                           SUBJECTIVE STATEMENT: Pt just feels like the SIJ is loose and popping a lot moving around.  Pt has back pain with lifting and leaning. I just felt like this pregnancy was harder.  I  notice coning. Fluid intake: lots of water   PAIN:  Are you having pain? Yes (while doing certain activities) NPRS scale: 1-2/10 Pain location: Bilateral and low back  Pain type: aching Pain description: intermittent   Aggravating factors: just doing the activities leaning forward Relieving factors: goes away immediately  PRECAUTIONS: None  WEIGHT BEARING RESTRICTIONS No  FALLS:  Has patient fallen in last 6 months? No  LIVING ENVIRONMENT: Lives with: lives with their family, lives with their spouse, lives with their partner, and lives with their son Lives  in: House/apartment   OCCUPATION: PT pediatric  PLOF: Independent  PATIENT GOALS feel stronger  PERTINENT HISTORY:  2 c-section Sexual abuse: No  BOWEL MOVEMENT Pain with bowel movement: No Type of bowel movement:Strain No   URINATION Pain with urination: No Fully empty bladder: Yes:   Stream: Strong  Leakage:  No Pads: No  INTERCOURSE Pain with intercourse: not sexually active yet Did have but was breast feeding   PREGNANCY Vaginal deliveries 0  C-section deliveries 2 Currently pregnant No  PROLAPSE None    OBJECTIVE:   DIAGNOSTIC FINDINGS:    PATIENT SURVEYS:    PFIQ-7   COGNITION:  Overall cognitive status: Within functional limits for tasks assessed      MUSCLE LENGTH:  LUMBAR SPECIAL TESTS:  ASLR test neg SI joint compression/distraction, thigh thrust - negative  FUNCTIONAL TESTS:  Single leg stand normal; stork test negative; forward flexion normal pelvic symmetry  GAIT:  Comments: WFL               POSTURE: anterior pelvic tilt   PELVIC ALIGNMENT: No abnormalities observed  LUMBARAROM/PROM  A/PROM A/PROM  eval  Flexion WFL  Extension   Right lateral flexion   Left lateral flexion   Right rotation   Left rotation    (Blank rows = not tested)  LOWER EXTREMITY ROM:  Passive ROM Right eval Left eval  Hip flexion    Hip extension    Hip abduction     Hip adduction    Hip internal rotation    Hip external rotation    Knee flexion    Knee extension    Ankle dorsiflexion    Ankle plantarflexion    Ankle inversion    Ankle eversion     (Blank rows = not tested)  LOWER EXTREMITY MMT:  MMT Right eval Left eval  Hip flexion 5/5 5/5  Hip extension 5/5 5/5  Hip abduction 5/5 4/5  Hip adduction 5/5 4/5+pain/popping in pubic symphysis  Hip internal rotation 4/5+spasm 4/5+spasm  Hip external rotation 5/5 5/5  Knee flexion    Knee extension    Ankle dorsiflexion    Ankle plantarflexion    Ankle inversion    Ankle eversion      PALPATION:   General  more muscle tone Rt pelvic floor palpated externally; tension and tenderness glute med and min Rt side; Lt piriformis tight and tender; scar tissue healing well but scar is still red/purple color and more tension and restricted on the left side                External Perineal Exam deferred                             Internal Pelvic Floor deferred  Patient confirms identification and approves PT to assess internal pelvic floor and treatment Yes but will defer today due to focus on other areas and no pelvic floor related issue apparent   PELVIC MMT:   MMT eval  Vaginal   Internal Anal Sphincter   External Anal Sphincter   Puborectalis   Diastasis Recti   (Blank rows = not tested)        TONE:   PROLAPSE:   TODAY'S TREATMENT  EVAL with educated and performed intial HEP - transversus abdominus activation Trigger Point Dry-Needling  Treatment instructions: Expect mild to moderate muscle soreness. S/S of pneumothorax if dry needled over a lung field, and to seek immediate  medical attention should they occur. Patient verbalized understanding of these instructions and education.  Patient Consent Given: Yes Education handout provided: Previously provided Muscles treated: Lt piriformis, bil glute med and min Electrical stimulation performed: No Parameters: N/A Treatment  response/outcome: increased muscle length bilaterally    PATIENT EDUCATION:  Education details: Access Code: [email or web address] Person educated: Patient Education method: Explanation, Demonstration, Tactile cues, Verbal cues, and Handouts Education comprehension: verbalized understanding and returned demonstration   HOME EXERCISE PROGRAM: Access Code: [email or web address] URL: [email or web address] Date: 04/21/2022 Prepared by: Dwana Curd  Exercises - Supine Transversus Abdominis Bracing with Heel Slide  - 1 x daily - 7 x weekly - 2 sets - 10 reps - Supine Shoulder Overhead Flexion with Medicine Ball  - 1 x daily - 7 x weekly - 3 sets - 10 reps  ASSESSMENT:  CLINICAL IMPRESSION: Patient is a 31 y.o. female who was seen today for physical therapy evaluation and treatment for s/p c-section. Pt has been having mild low back pain, decreased sensation of core muscle activation and popping in the SI joint which has been painful in the past.  Pt has weakness in the left hip and diastasis of the rectus abdominus of 2-3 finger and 1 knuckle deep.  Pt has tension in lumbar paraspinals and gluteal muscles bilaterally.  Upon palpation of the pelvic floor externally, tone appears normal but more muscle thickness palpated on the rt side.  Pt    OBJECTIVE IMPAIRMENTS decreased coordination, decreased strength, increased muscle spasms, and pain.   ACTIVITY LIMITATIONS lifting, bending, and caring for others  PARTICIPATION LIMITATIONS: interpersonal relationship and occupation  PERSONAL FACTORS Time since onset of injury/illness/exacerbation and 1-2 comorbidities: 2 c-sections  are also affecting patient's functional outcome.   REHAB POTENTIAL: Excellent  CLINICAL DECISION MAKING: Stable/uncomplicated  EVALUATION COMPLEXITY: Low   GOALS: Goals reviewed with patient? Yes  SHORT TERM GOALS: Target date: 05/19/2022  Ind with initial HEP and scar massage Baseline: Goal status:  INITIAL      LONG TERM GOALS: Target date: 07/14/2022   Pt will be independent with advanced HEP to maintain improvements made throughout therapy  Baseline:  Goal status: INITIAL  2.  Pt will report 80% reduction of pain due to improvements in posture, strength, and muscle length  Baseline: mild back pain with bending and lifting activities Goal status: INITIAL  3.  Pt will demonstrate 5/5 Lt hip strength without popping or pain for improved functional strength with lifting and bending Baseline:  Goal status: INITIAL  4.  Pt will have diastasis reduced to 1 finger width and without any depth for improved core strength preventing risk of injuries during functional activities. Baseline:  Goal status: INITIAL    PLAN: PT FREQUENCY: 1x/week  PT DURATION: 12 weeks  PLANNED INTERVENTIONS: Therapeutic exercises, Therapeutic activity, Neuromuscular re-education, Balance training, Gait training, Patient/Family education, Joint mobilization, Electrical stimulation, Spinal mobilization, Cryotherapy, Moist heat, scar mobilization, Taping, Biofeedback, Manual therapy, and Re-evaluation  PLAN FOR NEXT SESSION: internal soft tissue assessment if not noticed any changes with pelvic stability and treat as needed; f/u on dry needling to gluteals; core progression post partum routine   Brayton Caves Dujuan Stankowski, PT 04/21/2022, 2:38 PM

## 2022-04-21 NOTE — Assessment & Plan Note (Addendum)
controlled, eat a low fat diet, increase fiber intake (Benefiber or Metamucil, Cherrios,  oatmeal, beans, nuts, fruits and vegetables), limit saturated fats (in fried foods, red meat), can add OTC fish oil supplement, eat fish with Omega-3 fatty acids like salmon and tuna, exercise for 30 minutes 3 - 5 times a week, drink 8 - 10 glasses of water a day; lipid panel checked today

## 2022-04-22 LAB — COMPREHENSIVE METABOLIC PANEL
ALT: 30 IU/L (ref 0–32)
AST: 30 IU/L (ref 0–40)
Albumin/Globulin Ratio: 1.9 (ref 1.2–2.2)
Albumin: 5.1 g/dL — ABNORMAL HIGH (ref 3.8–4.8)
Alkaline Phosphatase: 126 IU/L — ABNORMAL HIGH (ref 44–121)
BUN/Creatinine Ratio: 13 (ref 9–23)
BUN: 10 mg/dL (ref 6–20)
Bilirubin Total: 1.4 mg/dL — ABNORMAL HIGH (ref 0.0–1.2)
CO2: 21 mmol/L (ref 20–29)
Calcium: 10.2 mg/dL (ref 8.7–10.2)
Chloride: 100 mmol/L (ref 96–106)
Creatinine, Ser: 0.8 mg/dL (ref 0.57–1.00)
Globulin, Total: 2.7 g/dL (ref 1.5–4.5)
Glucose: 71 mg/dL (ref 70–99)
Potassium: 4.2 mmol/L (ref 3.5–5.2)
Sodium: 142 mmol/L (ref 134–144)
Total Protein: 7.8 g/dL (ref 6.0–8.5)
eGFR: 100 mL/min/{1.73_m2} (ref >59)

## 2022-04-22 LAB — CBC WITH DIFFERENTIAL/PLATELET
Basophils Absolute: 0 10*3/uL (ref 0.0–0.2)
Basos: 0 %
EOS (ABSOLUTE): 0 10*3/uL (ref 0.0–0.4)
Eos: 0 %
Hematocrit: 43.2 % (ref 34.0–46.6)
Hemoglobin: 14.6 g/dL (ref 11.1–15.9)
Immature Grans (Abs): 0 10*3/uL (ref 0.0–0.1)
Immature Granulocytes: 0 %
Lymphocytes Absolute: 2 10*3/uL (ref 0.7–3.1)
Lymphs: 21 %
MCH: 30.5 pg (ref 26.6–33.0)
MCHC: 33.8 g/dL (ref 31.5–35.7)
MCV: 90 fL (ref 79–97)
Monocytes Absolute: 0.3 10*3/uL (ref 0.1–0.9)
Monocytes: 4 %
Neutrophils Absolute: 6.9 10*3/uL (ref 1.4–7.0)
Neutrophils: 75 %
Platelets: 216 10*3/uL (ref 150–450)
RBC: 4.79 x10E6/uL (ref 3.77–5.28)
RDW: 12.5 % (ref 11.7–15.4)
WBC: 9.4 10*3/uL (ref 3.4–10.8)

## 2022-04-22 LAB — TSH+FREE T4
Free T4: 1.33 ng/dL (ref 0.82–1.77)
TSH: 1.08 u[IU]/mL (ref 0.450–4.500)

## 2022-04-22 LAB — LIPID PANEL
Chol/HDL Ratio: 2.7 ratio (ref 0.0–4.4)
Cholesterol, Total: 205 mg/dL — ABNORMAL HIGH (ref 100–199)
HDL: 75 mg/dL (ref >39)
LDL Chol Calc (NIH): 120 mg/dL — ABNORMAL HIGH (ref 0–99)
Triglycerides: 53 mg/dL (ref 0–149)
VLDL Cholesterol Cal: 10 mg/dL (ref 5–40)

## 2022-04-23 ENCOUNTER — Encounter: Payer: Self-pay | Admitting: Internal Medicine

## 2022-05-17 NOTE — Therapy (Addendum)
OUTPATIENT PHYSICAL THERAPY TREATMENT NOTE   Patient Name: Ashley Allison MRN: [id number] DOB:Apr 19, 1991, 31 y.o., female Today's Date: 05/18/2022  PCP: Marcellina Millin   REFERRING PROVIDER: Wess Botts, MD  END OF SESSION:   PT End of Session - 05/18/22 1506     Visit Number 2    Date for PT Re-Evaluation 07/14/22    Authorization Type [employer] - [employer] employee    PT Start Time 2585    PT Stop Time 1224    PT Time Calculation (min) 39 min    Activity Tolerance Patient tolerated treatment well    Behavior During Therapy [facility] for tasks assessed/performed             Past Medical History:  Diagnosis Date   Female infertility associated with anovulation 05/04/2019   History of chicken pox    Normal labor and delivery 05/30/2018   Pregnancy 02/25/2022   Past Surgical History:  Procedure Laterality Date   CESAREAN SECTION N/A 05/31/2018   Procedure: CESAREAN SECTION;  Surgeon: Marylynn Pearson, MD;  Location: [place];  Service: Obstetrics;  Laterality: N/A;   CESAREAN SECTION N/A 02/25/2022   Procedure: REPEAT CESAREAN SECTION EDC: 03-03-22 ALLERG: NKDA  PREVIOUS X 1;  Surgeon: [name], MD;  Location: [place] LD [facility]S;  Service: Obstetrics;  Laterality: N/A;   [place] EXTRACTION  2012   Patient Active Problem List   Diagnosis Date Noted   Mixed hyperlipidemia 04/21/2022   Absolute anemia 04/21/2022   S/P cesarean section 05/31/2018    REFERRING DIAG: post partum state  THERAPY DIAG:  Muscle weakness (generalized)  Other muscle spasm  Rationale for Evaluation and Treatment Rehabilitation  PERTINENT HISTORY: c-section 02/25/22, 2 c-sections total  PRECAUTIONS: None  SUBJECTIVE: I golfed and have been working out more and doing well with that.  When lying flat I feel it in my pelvis in my SI joints.  That is when lying on a hard surface.  PAIN:  Are you having pain? No   OBJECTIVE: (objective measures completed at initial  evaluation unless otherwise dated) MUSCLE LENGTH:   LUMBAR SPECIAL TESTS:  ASLR test neg SI joint compression/distraction, thigh thrust - negative   FUNCTIONAL TESTS:  Single leg stand normal; stork test negative; forward flexion normal pelvic symmetry   GAIT:   Comments: WFL                POSTURE: anterior pelvic tilt               PELVIC ALIGNMENT: No abnormalities observed   LUMBARAROM/PROM   A/PROM A/PROM  eval  Flexion WFL  Extension    Right lateral flexion    Left lateral flexion    Right rotation    Left rotation     (Blank rows = not tested)   LOWER EXTREMITY ROM:   Passive ROM Right eval Left eval  Hip flexion      Hip extension      Hip abduction      Hip adduction      Hip internal rotation      Hip external rotation      Knee flexion      Knee extension      Ankle dorsiflexion      Ankle plantarflexion      Ankle inversion      Ankle eversion       (Blank rows = not tested)   LOWER EXTREMITY MMT:   MMT  Right eval Left eval  Hip flexion 5/5 5/5  Hip extension 5/5 5/5  Hip abduction 5/5 4/5  Hip adduction 5/5 4/5+pain/popping in pubic symphysis  Hip internal rotation 4/5+spasm 4/5+spasm  Hip external rotation 5/5 5/5  Knee flexion      Knee extension      Ankle dorsiflexion      Ankle plantarflexion      Ankle inversion      Ankle eversion         PALPATION:   General  more muscle tone Rt pelvic floor palpated externally; tension and tenderness glute med and min Rt side; Lt piriformis tight and tender; scar tissue healing well but scar is still red/purple color and more tension and restricted on the left side                 External Perineal Exam deferred                             Internal Pelvic Floor deferred   Patient confirms identification and approves PT to assess internal pelvic floor and treatment Yes but will defer today due to focus on other areas and no pelvic floor related issue apparent    PELVIC MMT:   MMT  eval  Vaginal    Internal Anal Sphincter    External Anal Sphincter    Puborectalis    Diastasis Recti    (Blank rows = not tested)         TONE:     PROLAPSE:     TODAY'S TREATMENT  Treatment: 05/18/22 Exercises  Supine alt LE with UE above head Alt diagonal supine Qped - hip shift and lift; UE reaching, primal push up - 10x each Pallof presses - staggered stance and rotation - green band - 10x each Qped hip IR - cat camel - rocking back Hip IR with red band - 15x each side  Nuero Re-ed Education and cues for coordination of breathing and pelvic floor muscle contracting and relaxing at appropriate times; transversus abdominus activation    04/21/22 EVAL with educated and performed intial HEP - transversus abdominus activation Trigger Point Dry-Needling  Treatment instructions: Expect mild to moderate muscle soreness. S/S of pneumothorax if dry needled over a lung field, and to seek immediate medical attention should they occur. Patient verbalized understanding of these instructions and education.   Patient Consent Given: Yes Education handout provided: Previously provided Muscles treated: Lt piriformis, bil glute med and min Electrical stimulation performed: No Parameters: N/A Treatment response/outcome: increased muscle length bilaterally       PATIENT EDUCATION:  Education details: Access Code: [email or web address] Person educated: Patient Education method: Explanation, Demonstration, Tactile cues, Verbal cues, and Handouts Education comprehension: verbalized understanding and returned demonstration     HOME EXERCISE PROGRAM: Access Code: [email or web address] URL: [email or web address] Date: 05/18/2022 Prepared by: Jari Favre  Exercises - Supine Transversus Abdominis Bracing with Heel Slide  - 1 x daily - 7 x weekly - 2 sets - 10 reps - Supine Shoulder Overhead Flexion with Medicine Ball  - 1 x daily - 7 x weekly - 3 sets - 10 reps - Quadruped Alternating  Arm Lift  - 1 x daily - 7 x weekly - 2 sets - 10 reps - Primal Push Up  - 1 x daily - 7 x weekly - 3 sets - 10 reps - Rock  - 1 x daily - 7 x weekly - 3 sets -  10 reps - Supine Hip Internal and External Rotation  - 1 x daily - 7 x weekly - 1 sets - 10 reps - 5 sec hold - Seated Hip Internal Rotation with Resistance  - 1 x daily - 7 x weekly - 3 sets - 10 reps   ASSESSMENT:   CLINICAL IMPRESSION: Patient is doing better and has been able to golf and exercises more over the past several weeks.  Pt was able to progress core exercises today.  Hip IR stretches given as tension noted with her needing cues in qped to lift the tailbone.  Pt will benefit from skilled PT to advance core exercises and improve strength for return to work and normal level of activities without SI pain.     OBJECTIVE IMPAIRMENTS decreased coordination, decreased strength, increased muscle spasms, and pain.    ACTIVITY LIMITATIONS lifting, bending, and caring for others   PARTICIPATION LIMITATIONS: interpersonal relationship and occupation   PERSONAL FACTORS Time since onset of injury/illness/exacerbation and 1-2 comorbidities: 2 c-sections  are also affecting patient's functional outcome.    REHAB POTENTIAL: Excellent   CLINICAL DECISION MAKING: Stable/uncomplicated   EVALUATION COMPLEXITY: Low     GOALS: Goals reviewed with patient? Yes   SHORT TERM GOALS: Target date: 05/19/2022   Ind with initial HEP and scar massage Baseline: Goal status: MET           LONG TERM GOALS: Target date: 07/14/2022    Pt will be independent with advanced HEP to maintain improvements made throughout therapy   Baseline:  Goal status: INITIAL   2.  Pt will report 80% reduction of pain due to improvements in posture, strength, and muscle length   Baseline: mild back pain with bending and lifting activities Goal status: INITIAL   3.  Pt will demonstrate 5/5 Lt hip strength without popping or pain for improved functional  strength with lifting and bending Baseline:  Goal status: INITIAL   4.  Pt will have diastasis reduced to 1 finger width and without any depth for improved core strength preventing risk of injuries during functional activities. Baseline:  Goal status: INITIAL       PLAN: PT FREQUENCY: 1x/week   PT DURATION: 12 weeks   PLANNED INTERVENTIONS: Therapeutic exercises, Therapeutic activity, Neuromuscular re-education, Balance training, Gait training, Patient/Family education, Joint mobilization, Electrical stimulation, Spinal mobilization, Cryotherapy, Moist heat, scar mobilization, Taping, Biofeedback, Manual therapy, and Re-evaluation   PLAN FOR NEXT SESSION: progress transversus abdominus activation; hip strength with single leg, wood peckers; see if c-section scar massage is going okay at home add cupping if needed to scar tissue    Camillo Flaming Tyechia Allmendinger, PT 05/18/2022, 3:09 PM    PHYSICAL THERAPY DISCHARGE SUMMARY  Visits from Start of Care: 2  Current functional level related to goals / functional outcomes:   See above goals for recent update; pt called to report feeling better and is working without any issues Remaining deficits: See above deficits   Education / Equipment: HEP   Patient agrees to discharge. Patient goals were partially met. Patient is being discharged due to being pleased with the current functional level.  Gustavus Bryant, PT 05/27/22 2:11 PM

## 2022-05-18 ENCOUNTER — Encounter: Payer: Self-pay | Admitting: Physical Therapy

## 2022-05-18 ENCOUNTER — Ambulatory Visit
Payer: No Typology Code available for payment source | Attending: Obstetrics and Gynecology | Admitting: Physical Therapy

## 2022-05-18 DIAGNOSIS — M6281 Muscle weakness (generalized): Secondary | ICD-10-CM | POA: Insufficient documentation

## 2022-05-18 DIAGNOSIS — M62838 Other muscle spasm: Secondary | ICD-10-CM | POA: Insufficient documentation

## 2022-05-28 ENCOUNTER — Ambulatory Visit: Payer: No Typology Code available for payment source | Admitting: Physical Therapy

## 2022-06-04 ENCOUNTER — Encounter: Payer: No Typology Code available for payment source | Admitting: Physical Therapy

## 2022-06-11 ENCOUNTER — Encounter: Payer: No Typology Code available for payment source | Admitting: Physical Therapy

## 2022-06-18 ENCOUNTER — Encounter: Payer: No Typology Code available for payment source | Admitting: Physical Therapy

## 2022-06-25 ENCOUNTER — Encounter: Payer: No Typology Code available for payment source | Admitting: Physical Therapy

## 2022-09-25 ENCOUNTER — Telehealth: Payer: No Typology Code available for payment source | Admitting: Nurse Practitioner

## 2022-09-25 DIAGNOSIS — U071 COVID-19: Secondary | ICD-10-CM

## 2022-09-25 NOTE — Progress Notes (Signed)
E-Visit for Corona Virus Screening  Your current symptoms could be consistent with the coronavirus.  Many health care providers can now test patients at their office but not all are.  [facility] has multiple testing sites. For information on our [place] testing locations and hours go to [email or web address]  We are enrolling you in our [place] for [place] . Daily you will receive a questionnaire within the [facility] website. Our COVID 19 response team will be monitoring your responses daily.  Testing Information: The COVID-19 Community Testing sites are testing BY APPOINTMENT ONLY.  You can schedule online at [email or web address]  If you do not have access to a smart phone or computer you may call [phone] for an appointment.  Due to pregnancy you can only take robitussin DM or sudafed.  Additional testing sites in the Community:  For [facility] Testing sites in [place]  FaceUpdate.[email or web address]  For Pop-up testing sites in [place]  [email or web address]  For [facility] [email or web address]  For [facility] testing in [place] and [employer] [email or web address]  For [facility] testing in [place]   [email or web address]  For  more information about community testing call [phone]   Please quarantine yourself while awaiting your test results. Please stay home for a minimum of 10 days from the first day of illness with improving symptoms and you have had 24 hours of no fever (without the use of Tylenol (Acetaminophen) Motrin (Ibuprofen) or any fever reducing medication).  Also - Do not get tested prior to returning to work because once  you have had a positive test the test can stay positive for more than a month in some cases.   You should wear a mask or cloth face covering over your nose and mouth if you must be around other people or animals, including pets (even at home). Try to stay at least 6 feet away from other people. This will protect the people around you.  Please continue good preventive care measures, including:  frequent hand-washing, avoid touching your face, cover coughs/sneezes, stay out of crowds and keep a 6 foot distance from others.  COVID-19 is a respiratory illness with symptoms that are similar to the flu. Symptoms are typically mild to moderate, but there have been cases of severe illness and death due to the virus.   The following symptoms may appear 2-14 days after exposure: Fever Cough Shortness of breath or difficulty breathing Chills Repeated shaking with chills Muscle pain Headache Sore throat New loss of taste or smell Fatigue Congestion or runny nose Nausea or vomiting Diarrhea  Go to the nearest hospital ED for assessment if fever/cough/breathlessness are severe or illness seems like a threat to life.  It is vitally important that if you feel that you have an infection such as this virus or any other virus that you stay home and away from places where you may spread it to others.  You should avoid contact with people age 65 and older.     You may also take acetaminophen (Tylenol) as needed for fever.  Reduce your risk of any infection by using the same precautions used for avoiding the common cold or flu:  Wash your hands often with soap and warm water for at least 20 seconds.  If soap and water are not readily available, use an alcohol-based hand sanitizer with at least 60% alcohol.  If coughing or sneezing, cover your mouth and nose by coughing or sneezing into the elbow areas of your shirt or  coat, into a tissue or into your sleeve (not your hands). Avoid shaking hands with others and  consider head nods or verbal greetings only. Avoid touching your eyes, nose, or mouth with unwashed hands.  Avoid close contact with people who are sick. Avoid places or events with large numbers of people in one location, like concerts or sporting events. Carefully consider travel plans you have or are making. If you are planning any travel outside or inside the [place], visit the CDC's Travelers' Health webpage for the latest health notices. If you have some symptoms but not all symptoms, continue to monitor at home and seek medical attention if your symptoms worsen. If you are having a medical emergency, call 911.  HOME CARE Only take medications as instructed by your medical team. Drink plenty of fluids and get plenty of rest. A steam or ultrasonic humidifier can help if you have congestion.   GET HELP RIGHT AWAY IF YOU HAVE EMERGENCY WARNING SIGNS** FOR COVID-19. If you or someone is showing any of these signs seek emergency medical care immediately. Call 911 or proceed to your closest emergency facility if: You develop worsening high fever. Trouble breathing Bluish lips or face Persistent pain or pressure in the chest New confusion Inability to wake or stay awake You cough up blood. Your symptoms become more severe  **This list is not all possible symptoms. Contact your medical provider for any symptoms that are sever or concerning to you.  MAKE SURE YOU  Understand these instructions. Will watch your condition. Will get help right away if you are not doing well or get worse.  Your e-visit answers were reviewed by a board certified advanced clinical practitioner to complete your personal care plan.  Depending on the condition, your plan could have included both over the counter or prescription medications.  If there is a problem please reply once you have received a response from your provider.  Your safety is important to [place].  If you have drug allergies check your prescription  carefully.    You can use MyChart to ask questions about today's visit, request a non-urgent call back, or ask for a work or school excuse for 24 hours related to this e-Visit. If it has been greater than 24 hours you will need to follow up with your provider, or enter a new e-Visit to address those concerns. You will get an e-mail in the next two days asking about your experience.  I hope that your e-visit has been valuable and will speed your recovery. Thank you for using e-visits.  Mary-Margaret Daphine Deutscher, FNP   5-10 minutes spent reviewing and documenting in chart.

## 2023-05-27 DIAGNOSIS — J302 Other seasonal allergic rhinitis: Secondary | ICD-10-CM | POA: Diagnosis not present

## 2023-06-15 DIAGNOSIS — Z0000 Encounter for general adult medical examination without abnormal findings: Secondary | ICD-10-CM | POA: Diagnosis not present

## 2023-06-15 DIAGNOSIS — Z13220 Encounter for screening for lipoid disorders: Secondary | ICD-10-CM | POA: Diagnosis not present

## 2023-06-15 DIAGNOSIS — Z114 Encounter for screening for human immunodeficiency virus [HIV]: Secondary | ICD-10-CM | POA: Diagnosis not present

## 2023-06-20 DIAGNOSIS — Z0000 Encounter for general adult medical examination without abnormal findings: Secondary | ICD-10-CM | POA: Diagnosis not present

## 2023-07-01 DIAGNOSIS — Z01419 Encounter for gynecological examination (general) (routine) without abnormal findings: Secondary | ICD-10-CM | POA: Diagnosis not present

## 2023-07-01 DIAGNOSIS — Z23 Encounter for immunization: Secondary | ICD-10-CM | POA: Diagnosis not present

## 2023-07-01 DIAGNOSIS — Z6822 Body mass index (BMI) 22.0-22.9, adult: Secondary | ICD-10-CM | POA: Diagnosis not present

## 2023-08-12 DIAGNOSIS — Z23 Encounter for immunization: Secondary | ICD-10-CM | POA: Diagnosis not present

## 2023-11-25 DIAGNOSIS — H5213 Myopia, bilateral: Secondary | ICD-10-CM | POA: Diagnosis not present

## 2023-12-07 DIAGNOSIS — K469 Unspecified abdominal hernia without obstruction or gangrene: Secondary | ICD-10-CM | POA: Diagnosis not present

## 2023-12-07 DIAGNOSIS — R030 Elevated blood-pressure reading, without diagnosis of hypertension: Secondary | ICD-10-CM | POA: Diagnosis not present

## 2023-12-08 ENCOUNTER — Other Ambulatory Visit: Payer: Self-pay | Admitting: Family Medicine

## 2023-12-08 DIAGNOSIS — K469 Unspecified abdominal hernia without obstruction or gangrene: Secondary | ICD-10-CM

## 2023-12-12 ENCOUNTER — Ambulatory Visit
Admission: RE | Admit: 2023-12-12 | Discharge: 2023-12-12 | Disposition: A | Discharge status: Home / Self Care | Payer: 59 | Source: Ambulatory Visit | Attending: Family Medicine | Admitting: Family Medicine

## 2023-12-12 DIAGNOSIS — R1031 Right lower quadrant pain: Secondary | ICD-10-CM | POA: Diagnosis not present

## 2023-12-12 DIAGNOSIS — R1903 Right lower quadrant abdominal swelling, mass and lump: Secondary | ICD-10-CM | POA: Diagnosis not present

## 2023-12-12 DIAGNOSIS — K469 Unspecified abdominal hernia without obstruction or gangrene: Secondary | ICD-10-CM

## 2023-12-16 DIAGNOSIS — Z23 Encounter for immunization: Secondary | ICD-10-CM | POA: Diagnosis not present

## 2024-06-15 DIAGNOSIS — Z0000 Encounter for general adult medical examination without abnormal findings: Secondary | ICD-10-CM | POA: Diagnosis not present

## 2024-07-06 DIAGNOSIS — Z01419 Encounter for gynecological examination (general) (routine) without abnormal findings: Secondary | ICD-10-CM | POA: Diagnosis not present

## 2024-07-06 DIAGNOSIS — Z124 Encounter for screening for malignant neoplasm of cervix: Secondary | ICD-10-CM | POA: Diagnosis not present

## 2024-07-06 DIAGNOSIS — Z309 Encounter for contraceptive management, unspecified: Secondary | ICD-10-CM | POA: Diagnosis not present

## 2024-07-06 DIAGNOSIS — Z1151 Encounter for screening for human papillomavirus (HPV): Secondary | ICD-10-CM | POA: Diagnosis not present

## 2024-07-06 DIAGNOSIS — Z6824 Body mass index (BMI) 24.0-24.9, adult: Secondary | ICD-10-CM | POA: Diagnosis not present

## 2024-09-13 DIAGNOSIS — Z30431 Encounter for routine checking of intrauterine contraceptive device: Secondary | ICD-10-CM | POA: Diagnosis not present
